# Patient Record
Sex: Female | Born: 1950
Health system: Southern US, Community
[De-identification: ages and names within clinical notes are randomized; demographics above are authoritative.]

## PROBLEM LIST (undated history)

## (undated) DIAGNOSIS — H547 Unspecified visual loss: Secondary | ICD-10-CM

## (undated) DIAGNOSIS — I10 Essential (primary) hypertension: Secondary | ICD-10-CM

## (undated) DIAGNOSIS — M1990 Unspecified osteoarthritis, unspecified site: Secondary | ICD-10-CM

## (undated) DIAGNOSIS — J45909 Unspecified asthma, uncomplicated: Secondary | ICD-10-CM

## (undated) DIAGNOSIS — L309 Dermatitis, unspecified: Secondary | ICD-10-CM

## (undated) HISTORY — PX: TONSILLECTOMY: SUR1361

## (undated) HISTORY — DX: Unspecified asthma, uncomplicated: J45.909

## (undated) HISTORY — PX: RETINAL DETACHMENT SURGERY: SHX105

## (undated) HISTORY — DX: Dermatitis, unspecified: L30.9

## (undated) HISTORY — PX: EYE SURGERY: SHX253

## (undated) HISTORY — PX: LUNG BIOPSY: SHX232

## (undated) HISTORY — PX: CHOLECYSTECTOMY: SHX55

## (undated) HISTORY — PX: CATARACT EXTRACTION, BILATERAL: SHX1313

## (undated) HISTORY — PX: COLONOSCOPY: SHX174

---

## 1999-05-25 ENCOUNTER — Encounter: Payer: Self-pay | Admitting: Emergency Medicine

## 1999-05-25 ENCOUNTER — Emergency Department (HOSPITAL_COMMUNITY): Admission: EM | Admit: 1999-05-25 | Discharge: 1999-05-25 | Payer: Self-pay | Admitting: Emergency Medicine

## 1999-08-22 ENCOUNTER — Encounter: Payer: Self-pay | Admitting: Emergency Medicine

## 1999-08-22 ENCOUNTER — Emergency Department (HOSPITAL_COMMUNITY): Admission: EM | Admit: 1999-08-22 | Discharge: 1999-08-22 | Payer: Self-pay | Admitting: Emergency Medicine

## 1999-12-30 ENCOUNTER — Other Ambulatory Visit: Admission: RE | Admit: 1999-12-30 | Discharge: 1999-12-30 | Payer: Self-pay | Admitting: Internal Medicine

## 2000-01-11 ENCOUNTER — Ambulatory Visit (HOSPITAL_COMMUNITY): Admission: RE | Admit: 2000-01-11 | Discharge: 2000-01-11 | Payer: Self-pay | Admitting: *Deleted

## 2000-01-16 ENCOUNTER — Ambulatory Visit (HOSPITAL_COMMUNITY): Admission: RE | Admit: 2000-01-16 | Discharge: 2000-01-16 | Payer: Self-pay | Admitting: Internal Medicine

## 2000-01-18 ENCOUNTER — Ambulatory Visit (HOSPITAL_COMMUNITY): Admission: RE | Admit: 2000-01-18 | Discharge: 2000-01-18 | Payer: Self-pay | Admitting: *Deleted

## 2000-01-24 ENCOUNTER — Ambulatory Visit (HOSPITAL_COMMUNITY): Admission: RE | Admit: 2000-01-24 | Discharge: 2000-01-24 | Payer: Self-pay | Admitting: Internal Medicine

## 2000-01-24 ENCOUNTER — Encounter: Payer: Self-pay | Admitting: Internal Medicine

## 2000-05-27 ENCOUNTER — Emergency Department (HOSPITAL_COMMUNITY): Admission: EM | Admit: 2000-05-27 | Discharge: 2000-05-28 | Payer: Self-pay | Admitting: *Deleted

## 2000-05-27 ENCOUNTER — Encounter: Payer: Self-pay | Admitting: Emergency Medicine

## 2000-05-28 ENCOUNTER — Inpatient Hospital Stay (HOSPITAL_COMMUNITY): Admission: EM | Admit: 2000-05-28 | Discharge: 2000-05-29 | Payer: Self-pay | Admitting: Emergency Medicine

## 2000-05-31 ENCOUNTER — Ambulatory Visit (HOSPITAL_COMMUNITY): Admission: RE | Admit: 2000-05-31 | Discharge: 2000-05-31 | Payer: Self-pay | Admitting: Gastroenterology

## 2000-06-14 ENCOUNTER — Encounter: Admission: RE | Admit: 2000-06-14 | Discharge: 2000-06-14 | Payer: Self-pay | Admitting: Otolaryngology

## 2000-06-14 ENCOUNTER — Encounter: Payer: Self-pay | Admitting: Otolaryngology

## 2000-10-05 ENCOUNTER — Encounter: Admission: RE | Admit: 2000-10-05 | Discharge: 2000-10-05 | Payer: Self-pay | Admitting: Otolaryngology

## 2000-10-05 ENCOUNTER — Encounter: Payer: Self-pay | Admitting: Otolaryngology

## 2001-01-13 ENCOUNTER — Encounter: Payer: Self-pay | Admitting: Emergency Medicine

## 2001-01-13 ENCOUNTER — Emergency Department (HOSPITAL_COMMUNITY): Admission: EM | Admit: 2001-01-13 | Discharge: 2001-01-13 | Payer: Self-pay | Admitting: Emergency Medicine

## 2001-03-07 ENCOUNTER — Ambulatory Visit (HOSPITAL_COMMUNITY): Admission: RE | Admit: 2001-03-07 | Discharge: 2001-03-07 | Payer: Self-pay | Admitting: Gastroenterology

## 2001-03-12 ENCOUNTER — Other Ambulatory Visit: Admission: RE | Admit: 2001-03-12 | Discharge: 2001-03-12 | Payer: Self-pay | Admitting: Family Medicine

## 2001-04-03 ENCOUNTER — Encounter: Payer: Self-pay | Admitting: Family Medicine

## 2001-04-03 ENCOUNTER — Ambulatory Visit (HOSPITAL_COMMUNITY): Admission: RE | Admit: 2001-04-03 | Discharge: 2001-04-03 | Payer: Self-pay

## 2002-01-31 ENCOUNTER — Encounter: Payer: Self-pay | Admitting: Emergency Medicine

## 2002-01-31 ENCOUNTER — Inpatient Hospital Stay (HOSPITAL_COMMUNITY): Admission: EM | Admit: 2002-01-31 | Discharge: 2002-02-02 | Payer: Self-pay | Admitting: Emergency Medicine

## 2002-04-08 ENCOUNTER — Encounter: Payer: Self-pay | Admitting: Family Medicine

## 2002-04-08 ENCOUNTER — Ambulatory Visit (HOSPITAL_COMMUNITY): Admission: RE | Admit: 2002-04-08 | Discharge: 2002-04-08 | Payer: Self-pay | Admitting: Family Medicine

## 2002-05-15 ENCOUNTER — Other Ambulatory Visit: Admission: RE | Admit: 2002-05-15 | Discharge: 2002-05-15 | Payer: Self-pay | Admitting: Family Medicine

## 2003-01-30 ENCOUNTER — Ambulatory Visit (HOSPITAL_COMMUNITY): Admission: RE | Admit: 2003-01-30 | Discharge: 2003-01-30 | Payer: Self-pay | Admitting: Family Medicine

## 2003-05-11 ENCOUNTER — Emergency Department (HOSPITAL_COMMUNITY): Admission: EM | Admit: 2003-05-11 | Discharge: 2003-05-11 | Payer: Self-pay | Admitting: Emergency Medicine

## 2003-06-12 ENCOUNTER — Ambulatory Visit (HOSPITAL_COMMUNITY): Admission: RE | Admit: 2003-06-12 | Discharge: 2003-06-12 | Payer: Self-pay | Admitting: Family Medicine

## 2003-06-29 ENCOUNTER — Emergency Department (HOSPITAL_COMMUNITY): Admission: EM | Admit: 2003-06-29 | Discharge: 2003-06-29 | Payer: Self-pay | Admitting: Emergency Medicine

## 2003-07-03 ENCOUNTER — Encounter: Payer: Self-pay | Admitting: Emergency Medicine

## 2003-07-03 ENCOUNTER — Emergency Department (HOSPITAL_COMMUNITY): Admission: EM | Admit: 2003-07-03 | Discharge: 2003-07-03 | Payer: Self-pay | Admitting: Emergency Medicine

## 2003-08-11 ENCOUNTER — Encounter: Payer: Self-pay | Admitting: Emergency Medicine

## 2003-08-11 ENCOUNTER — Emergency Department (HOSPITAL_COMMUNITY): Admission: EM | Admit: 2003-08-11 | Discharge: 2003-08-11 | Payer: Self-pay | Admitting: Emergency Medicine

## 2003-10-18 ENCOUNTER — Emergency Department (HOSPITAL_COMMUNITY): Admission: EM | Admit: 2003-10-18 | Discharge: 2003-10-18 | Payer: Self-pay | Admitting: Emergency Medicine

## 2003-11-04 ENCOUNTER — Emergency Department (HOSPITAL_COMMUNITY): Admission: EM | Admit: 2003-11-04 | Discharge: 2003-11-04 | Payer: Self-pay | Admitting: Emergency Medicine

## 2004-01-02 ENCOUNTER — Emergency Department (HOSPITAL_COMMUNITY): Admission: EM | Admit: 2004-01-02 | Discharge: 2004-01-02 | Payer: Self-pay | Admitting: Emergency Medicine

## 2004-01-09 ENCOUNTER — Ambulatory Visit (HOSPITAL_COMMUNITY): Admission: RE | Admit: 2004-01-09 | Discharge: 2004-01-09 | Payer: Self-pay | Admitting: Family Medicine

## 2004-02-29 ENCOUNTER — Emergency Department (HOSPITAL_COMMUNITY): Admission: RE | Admit: 2004-02-29 | Discharge: 2004-03-01 | Payer: Self-pay | Admitting: Neurology

## 2004-04-04 ENCOUNTER — Emergency Department (HOSPITAL_COMMUNITY): Admission: EM | Admit: 2004-04-04 | Discharge: 2004-04-04 | Payer: Self-pay | Admitting: Emergency Medicine

## 2004-06-16 ENCOUNTER — Ambulatory Visit (HOSPITAL_COMMUNITY): Admission: RE | Admit: 2004-06-16 | Discharge: 2004-06-16 | Payer: Self-pay | Admitting: Family Medicine

## 2004-07-12 ENCOUNTER — Other Ambulatory Visit: Admission: RE | Admit: 2004-07-12 | Discharge: 2004-07-12 | Payer: Self-pay | Admitting: Family Medicine

## 2004-08-09 ENCOUNTER — Ambulatory Visit (HOSPITAL_COMMUNITY): Admission: RE | Admit: 2004-08-09 | Discharge: 2004-08-09 | Payer: Self-pay | Admitting: Family Medicine

## 2004-08-17 ENCOUNTER — Ambulatory Visit (HOSPITAL_COMMUNITY): Admission: RE | Admit: 2004-08-17 | Discharge: 2004-08-17 | Payer: Self-pay | Admitting: Family Medicine

## 2004-09-02 ENCOUNTER — Ambulatory Visit (HOSPITAL_COMMUNITY): Admission: RE | Admit: 2004-09-02 | Discharge: 2004-09-02 | Payer: Self-pay | Admitting: Family Medicine

## 2004-09-22 ENCOUNTER — Encounter: Admission: RE | Admit: 2004-09-22 | Discharge: 2004-12-21 | Payer: Self-pay | Admitting: Orthopedic Surgery

## 2004-12-22 ENCOUNTER — Encounter: Admission: RE | Admit: 2004-12-22 | Discharge: 2005-01-13 | Payer: Self-pay | Admitting: Orthopedic Surgery

## 2005-01-13 ENCOUNTER — Ambulatory Visit: Payer: Self-pay | Admitting: Family Medicine

## 2005-01-16 ENCOUNTER — Ambulatory Visit (HOSPITAL_COMMUNITY): Admission: RE | Admit: 2005-01-16 | Discharge: 2005-01-16 | Payer: Self-pay | Admitting: Family Medicine

## 2005-02-03 ENCOUNTER — Ambulatory Visit: Payer: Self-pay | Admitting: Family Medicine

## 2005-06-05 ENCOUNTER — Ambulatory Visit: Payer: Self-pay | Admitting: Physical Medicine & Rehabilitation

## 2005-06-05 ENCOUNTER — Encounter
Admission: RE | Admit: 2005-06-05 | Discharge: 2005-09-03 | Payer: Self-pay | Admitting: Physical Medicine & Rehabilitation

## 2005-07-02 ENCOUNTER — Emergency Department (HOSPITAL_COMMUNITY): Admission: EM | Admit: 2005-07-02 | Discharge: 2005-07-02 | Payer: Self-pay | Admitting: Emergency Medicine

## 2005-07-06 ENCOUNTER — Ambulatory Visit: Payer: Self-pay | Admitting: Physical Medicine & Rehabilitation

## 2005-07-11 ENCOUNTER — Ambulatory Visit (HOSPITAL_COMMUNITY): Admission: RE | Admit: 2005-07-11 | Discharge: 2005-07-11 | Payer: Self-pay | Admitting: Family Medicine

## 2005-07-17 ENCOUNTER — Ambulatory Visit (HOSPITAL_COMMUNITY): Admission: RE | Admit: 2005-07-17 | Discharge: 2005-07-17 | Payer: Self-pay | Admitting: Family Medicine

## 2005-07-31 ENCOUNTER — Ambulatory Visit (HOSPITAL_COMMUNITY): Admission: RE | Admit: 2005-07-31 | Discharge: 2005-07-31 | Payer: Self-pay | Admitting: Gastroenterology

## 2005-07-31 ENCOUNTER — Encounter (INDEPENDENT_AMBULATORY_CARE_PROVIDER_SITE_OTHER): Payer: Self-pay | Admitting: Specialist

## 2005-08-03 ENCOUNTER — Ambulatory Visit: Payer: Self-pay | Admitting: Family Medicine

## 2005-10-03 ENCOUNTER — Ambulatory Visit: Payer: Self-pay | Admitting: Family Medicine

## 2005-11-06 ENCOUNTER — Ambulatory Visit: Payer: Self-pay | Admitting: Family Medicine

## 2005-11-28 ENCOUNTER — Ambulatory Visit: Payer: Self-pay | Admitting: Family Medicine

## 2005-12-11 ENCOUNTER — Ambulatory Visit: Payer: Self-pay | Admitting: Family Medicine

## 2006-01-18 ENCOUNTER — Ambulatory Visit: Payer: Self-pay | Admitting: Family Medicine

## 2006-05-18 ENCOUNTER — Ambulatory Visit: Payer: Self-pay | Admitting: Family Medicine

## 2006-07-19 ENCOUNTER — Encounter (INDEPENDENT_AMBULATORY_CARE_PROVIDER_SITE_OTHER): Payer: Self-pay | Admitting: *Deleted

## 2006-07-19 ENCOUNTER — Ambulatory Visit: Payer: Self-pay | Admitting: Family Medicine

## 2006-07-24 ENCOUNTER — Ambulatory Visit (HOSPITAL_COMMUNITY): Admission: RE | Admit: 2006-07-24 | Discharge: 2006-07-24 | Payer: Self-pay | Admitting: Family Medicine

## 2006-08-07 ENCOUNTER — Ambulatory Visit: Payer: Self-pay | Admitting: Family Medicine

## 2006-12-04 ENCOUNTER — Ambulatory Visit: Payer: Self-pay | Admitting: Family Medicine

## 2006-12-04 ENCOUNTER — Encounter (INDEPENDENT_AMBULATORY_CARE_PROVIDER_SITE_OTHER): Payer: Self-pay | Admitting: *Deleted

## 2007-01-21 ENCOUNTER — Ambulatory Visit: Payer: Self-pay | Admitting: Family Medicine

## 2007-01-25 ENCOUNTER — Ambulatory Visit (HOSPITAL_COMMUNITY): Admission: RE | Admit: 2007-01-25 | Discharge: 2007-01-25 | Payer: Self-pay | Admitting: Family Medicine

## 2007-04-01 ENCOUNTER — Ambulatory Visit: Payer: Self-pay | Admitting: Family Medicine

## 2007-07-02 DIAGNOSIS — K5909 Other constipation: Secondary | ICD-10-CM

## 2007-07-02 DIAGNOSIS — K219 Gastro-esophageal reflux disease without esophagitis: Secondary | ICD-10-CM

## 2007-07-02 DIAGNOSIS — M545 Low back pain, unspecified: Secondary | ICD-10-CM

## 2007-07-02 DIAGNOSIS — L639 Alopecia areata, unspecified: Secondary | ICD-10-CM | POA: Insufficient documentation

## 2007-07-02 DIAGNOSIS — J984 Other disorders of lung: Secondary | ICD-10-CM

## 2007-07-02 DIAGNOSIS — H209 Unspecified iridocyclitis: Secondary | ICD-10-CM

## 2007-07-02 DIAGNOSIS — J4520 Mild intermittent asthma, uncomplicated: Secondary | ICD-10-CM

## 2007-07-02 DIAGNOSIS — N951 Menopausal and female climacteric states: Secondary | ICD-10-CM

## 2007-07-02 DIAGNOSIS — F341 Dysthymic disorder: Secondary | ICD-10-CM

## 2007-07-02 DIAGNOSIS — Z87891 Personal history of nicotine dependence: Secondary | ICD-10-CM

## 2007-07-02 HISTORY — DX: Dysthymic disorder: F34.1

## 2007-07-02 HISTORY — DX: Gastro-esophageal reflux disease without esophagitis: K21.9

## 2007-07-02 HISTORY — DX: Alopecia areata, unspecified: L63.9

## 2007-07-02 HISTORY — DX: Other constipation: K59.09

## 2007-07-02 HISTORY — DX: Other disorders of lung: J98.4

## 2007-07-02 HISTORY — DX: Low back pain, unspecified: M54.50

## 2007-07-02 HISTORY — DX: Menopausal and female climacteric states: N95.1

## 2007-07-02 HISTORY — DX: Personal history of nicotine dependence: Z87.891

## 2007-07-02 HISTORY — DX: Unspecified iridocyclitis: H20.9

## 2007-07-02 HISTORY — DX: Mild intermittent asthma, uncomplicated: J45.20

## 2007-07-17 ENCOUNTER — Emergency Department (HOSPITAL_COMMUNITY): Admission: EM | Admit: 2007-07-17 | Discharge: 2007-07-18 | Payer: Self-pay | Admitting: Emergency Medicine

## 2007-08-01 ENCOUNTER — Ambulatory Visit (HOSPITAL_COMMUNITY): Admission: RE | Admit: 2007-08-01 | Discharge: 2007-08-01 | Payer: Self-pay | Admitting: Family Medicine

## 2008-03-13 ENCOUNTER — Ambulatory Visit: Payer: Self-pay | Admitting: Family Medicine

## 2008-03-13 LAB — CONVERTED CEMR LAB
ALT: 16 units/L (ref 0–35)
AST: 21 units/L (ref 0–37)
Albumin: 4.3 g/dL (ref 3.5–5.2)
Alkaline Phosphatase: 104 units/L (ref 39–117)
BUN: 13 mg/dL (ref 6–23)
Basophils Absolute: 0 10*3/uL (ref 0.0–0.1)
Basophils Relative: 0 % (ref 0–1)
CO2: 26 meq/L (ref 19–32)
Calcium: 9.6 mg/dL (ref 8.4–10.5)
Chloride: 103 meq/L (ref 96–112)
Cholesterol: 213 mg/dL — ABNORMAL HIGH (ref 0–200)
Creatinine, Ser: 0.74 mg/dL (ref 0.40–1.20)
Eosinophils Absolute: 0.2 10*3/uL (ref 0.0–0.7)
Eosinophils Relative: 2 % (ref 0–5)
Free T4: 1.06 ng/dL (ref 0.89–1.80)
Glucose, Bld: 86 mg/dL (ref 70–99)
HCT: 38.8 % (ref 36.0–46.0)
HDL: 81 mg/dL (ref >39)
Hemoglobin: 12 g/dL (ref 12.0–15.0)
LDL Cholesterol: 116 mg/dL — ABNORMAL HIGH (ref 0–99)
Lymphocytes Relative: 24 % (ref 12–46)
Lymphs Abs: 2 10*3/uL (ref 0.7–4.0)
MCHC: 30.9 g/dL (ref 30.0–36.0)
MCV: 72.8 fL — ABNORMAL LOW (ref 78.0–100.0)
Monocytes Absolute: 0.5 10*3/uL (ref 0.1–1.0)
Monocytes Relative: 6 % (ref 3–12)
Neutro Abs: 5.5 10*3/uL (ref 1.7–7.7)
Neutrophils Relative %: 68 % (ref 43–77)
Platelets: 214 10*3/uL (ref 150–400)
Potassium: 3.8 meq/L (ref 3.5–5.3)
RBC: 5.33 M/uL — ABNORMAL HIGH (ref 3.87–5.11)
RDW: 14.6 % (ref 11.5–15.5)
Sodium: 141 meq/L (ref 135–145)
T3 Uptake Ratio: 28.2 % (ref 22.5–37.0)
T3, Total: 157.5 ng/dL (ref 80.0–204.0)
TSH: 1.545 microintl units/mL (ref 0.350–5.50)
Total Bilirubin: 0.7 mg/dL (ref 0.3–1.2)
Total CHOL/HDL Ratio: 2.6
Total Protein: 8 g/dL (ref 6.0–8.3)
Triglycerides: 81 mg/dL (ref <150)
VLDL: 16 mg/dL (ref 0–40)
WBC: 8.1 10*3/uL (ref 4.0–10.5)

## 2008-03-17 ENCOUNTER — Ambulatory Visit (HOSPITAL_COMMUNITY): Admission: RE | Admit: 2008-03-17 | Discharge: 2008-03-17 | Payer: Self-pay | Admitting: Family Medicine

## 2008-03-23 ENCOUNTER — Ambulatory Visit (HOSPITAL_COMMUNITY): Admission: RE | Admit: 2008-03-23 | Discharge: 2008-03-23 | Payer: Self-pay | Admitting: Ophthalmology

## 2008-04-16 ENCOUNTER — Emergency Department (HOSPITAL_COMMUNITY): Admission: EM | Admit: 2008-04-16 | Discharge: 2008-04-17 | Payer: Self-pay | Admitting: Emergency Medicine

## 2008-06-09 ENCOUNTER — Ambulatory Visit: Payer: Self-pay | Admitting: Family Medicine

## 2008-06-09 LAB — CONVERTED CEMR LAB
BUN: 13 mg/dL (ref 6–23)
Basophils Absolute: 0 10*3/uL (ref 0.0–0.1)
Basophils Relative: 0 % (ref 0–1)
CO2: 25 meq/L (ref 19–32)
Calcium: 9.1 mg/dL (ref 8.4–10.5)
Chloride: 106 meq/L (ref 96–112)
Cholesterol: 197 mg/dL (ref 0–200)
Creatinine, Ser: 0.76 mg/dL (ref 0.40–1.20)
Eosinophils Absolute: 0.1 10*3/uL (ref 0.0–0.7)
Eosinophils Relative: 1 % (ref 0–5)
Free T4: 1.11 ng/dL (ref 0.89–1.80)
Glucose, Bld: 91 mg/dL (ref 70–99)
HCT: 36.6 % (ref 36.0–46.0)
HDL: 73 mg/dL (ref >39)
Hemoglobin: 11.2 g/dL — ABNORMAL LOW (ref 12.0–15.0)
LDL Cholesterol: 104 mg/dL — ABNORMAL HIGH (ref 0–99)
Lymphocytes Relative: 31 % (ref 12–46)
Lymphs Abs: 2.4 10*3/uL (ref 0.7–4.0)
MCHC: 30.6 g/dL (ref 30.0–36.0)
MCV: 73.1 fL — ABNORMAL LOW (ref 78.0–100.0)
Magnesium: 1.9 mg/dL (ref 1.5–2.5)
Monocytes Absolute: 0.6 10*3/uL (ref 0.1–1.0)
Monocytes Relative: 8 % (ref 3–12)
Neutro Abs: 4.6 10*3/uL (ref 1.7–7.7)
Neutrophils Relative %: 60 % (ref 43–77)
Platelets: 213 10*3/uL (ref 150–400)
Potassium: 3.7 meq/L (ref 3.5–5.3)
RBC: 5.01 M/uL (ref 3.87–5.11)
RDW: 15.3 % (ref 11.5–15.5)
Sodium: 144 meq/L (ref 135–145)
TSH: 2.262 microintl units/mL (ref 0.350–5.50)
Total CHOL/HDL Ratio: 2.7
Triglycerides: 101 mg/dL (ref <150)
VLDL: 20 mg/dL (ref 0–40)
Vit D, 1,25-Dihydroxy: 12 — ABNORMAL LOW (ref 30–89)
WBC: 7.7 10*3/uL (ref 4.0–10.5)

## 2008-08-04 ENCOUNTER — Ambulatory Visit (HOSPITAL_COMMUNITY): Admission: RE | Admit: 2008-08-04 | Discharge: 2008-08-04 | Payer: Self-pay | Admitting: Family Medicine

## 2008-10-09 ENCOUNTER — Encounter: Admission: RE | Admit: 2008-10-09 | Discharge: 2008-10-09 | Payer: Self-pay | Admitting: Family Medicine

## 2008-11-28 ENCOUNTER — Emergency Department (HOSPITAL_COMMUNITY): Admission: EM | Admit: 2008-11-28 | Discharge: 2008-11-28 | Payer: Self-pay | Admitting: Emergency Medicine

## 2009-08-17 ENCOUNTER — Ambulatory Visit (HOSPITAL_COMMUNITY): Admission: RE | Admit: 2009-08-17 | Discharge: 2009-08-17 | Payer: Self-pay | Admitting: Family Medicine

## 2009-12-29 ENCOUNTER — Ambulatory Visit (HOSPITAL_COMMUNITY): Admission: RE | Admit: 2009-12-29 | Discharge: 2009-12-29 | Payer: Self-pay | Admitting: Ophthalmology

## 2011-03-12 LAB — CBC
HCT: 39.1 % (ref 36.0–46.0)
Hemoglobin: 12.9 g/dL (ref 12.0–15.0)
MCHC: 32.9 g/dL (ref 30.0–36.0)
MCV: 70.9 fL — ABNORMAL LOW (ref 78.0–100.0)
Platelets: 230 10*3/uL (ref 150–400)
RBC: 5.52 MIL/uL — ABNORMAL HIGH (ref 3.87–5.11)
RDW: 14 % (ref 11.5–15.5)
WBC: 8.5 10*3/uL (ref 4.0–10.5)

## 2011-03-12 LAB — BASIC METABOLIC PANEL
BUN: 9 mg/dL (ref 6–23)
CO2: 28 mEq/L (ref 19–32)
Calcium: 9.4 mg/dL (ref 8.4–10.5)
Chloride: 105 mEq/L (ref 96–112)
Creatinine, Ser: 0.74 mg/dL (ref 0.4–1.2)
GFR calc Af Amer: >60 mL/min (ref >60)
GFR calc non Af Amer: >60 mL/min (ref >60)
Glucose, Bld: 102 mg/dL — ABNORMAL HIGH (ref 70–99)
Potassium: 4.5 mEq/L (ref 3.5–5.1)
Sodium: 142 mEq/L (ref 135–145)

## 2011-04-24 ENCOUNTER — Ambulatory Visit: Payer: Medicare Other | Attending: Family Medicine | Admitting: Physical Therapy

## 2011-04-24 DIAGNOSIS — R269 Unspecified abnormalities of gait and mobility: Secondary | ICD-10-CM | POA: Insufficient documentation

## 2011-04-24 DIAGNOSIS — IMO0001 Reserved for inherently not codable concepts without codable children: Secondary | ICD-10-CM | POA: Insufficient documentation

## 2011-04-24 DIAGNOSIS — R42 Dizziness and giddiness: Secondary | ICD-10-CM | POA: Insufficient documentation

## 2011-05-08 ENCOUNTER — Ambulatory Visit: Payer: Medicare Other | Attending: Family Medicine | Admitting: Physical Therapy

## 2011-05-08 DIAGNOSIS — IMO0001 Reserved for inherently not codable concepts without codable children: Secondary | ICD-10-CM | POA: Insufficient documentation

## 2011-05-08 DIAGNOSIS — R269 Unspecified abnormalities of gait and mobility: Secondary | ICD-10-CM | POA: Insufficient documentation

## 2011-05-08 DIAGNOSIS — R42 Dizziness and giddiness: Secondary | ICD-10-CM | POA: Insufficient documentation

## 2011-05-09 NOTE — Op Note (Signed)
NAMEDAWNNA, GRITZ              ACCOUNT NO.:  [id number]   MEDICAL RECORD NO.:  [id number]          PATIENT TYPE:  AMB   LOCATION:  SDS                          FACILITY:  [facility]   PHYSICIAN:  Alford Highland. Rankin, M.D.   DATE OF BIRTH:  [birth year]   DATE OF PROCEDURE:  03/23/2008  DATE OF DISCHARGE:                               OPERATIVE REPORT   PREOPERATIVE DIAGNOSES:  1. Tractional retinal detachment, right eye secondary to  2. Vitreal macular retraction syndrome, right eye with epiretinal      membrane.  3. Epiretinal membrane.  4. Panuveitis.  5. 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   POSTOPERATIVE DIAGNOSES:   OPERATION PERFORMED:  1. Posterior vitrectomy with membrane peel--epiretinal membrane--25      gauge, right eye.  2. Insertion of Retisert implant--inferonasal quadrant of the right      eye.   SURGEON:  Alford Highland. Rankin, M.D.   ANESTHESIA:  Attempted local retrobulbar with monitored anesthesia  control of the right eye, but the patient was highly talkative, highly  sensitive to any  type of touching and all very talkative and we were  unable to proceed with this under local anesthesia and so converted to  general endotracheal anesthesia.   INDICATIONS FOR PROCEDURE:  The patient is a 59 year old woman with  profound vision loss in each eye, both eyes on the basis of Harada's  syndrome with chorioretinal scarring, panuveitis, now has progressive  vision loss of the right eye and elevation of the retina secondary to  vitreal macular retraction syndrome and epiretinal membrane with partial  vitreous elevation coming from the midperipheral vitreous onto the  foveal region.  The patient understands this is an attempt to surgically  remove the vitreous scarring.  She also understands this is an attempt  to place Retisert implant so as to  help control her symptoms on an  ongoing basis.  She understands the risks of anesthesia including the  rare occurrence of death but also to the eye including but not limited  to hemorrhage, infection, scarring, need for another surgery, no change  in vision, loss of vision and progression of disease despite  intervention.  After appropriate signed consent was obtained, the  patient was taken to the operating room.   DESCRIPTION OF PROCEDURE:  In the operating room, appropriate monitoring  was followed by mild sedation.  2% Xylocaine was injected retrobulbar 5  mL followed by an additional 5 mL laterally in the fashion of modified  Darel Hong.  Nonetheless over the next 10 to 15 minutes, attempts to prep  the eye and also to place the sterile drapes resulted in the patient  complaining bitterly and repeatedly about discomfort, ill at ease,  extremely nervous, then was resistant to medical therapy in this regard.  The decision was made at this time for the safety of her eye and her to  convert to general anesthesia.  General endotracheal anesthesia was  administered  without difficulty.  The right periocular region was  sterilely prepped and draped again.  The right periocular region was  sterilely prepped and draped in the usual ophthalmic fashion.  A lid  speculum was applied.  25 gauge trocar was placed in the infratemporal  quadrant.  The conjunctiva was quite mobile.  The superior trocar was  applied.  Core vitrectomy was then begun.  Biom attachment on the  microscope was used to visualize the retina. Notable findings were that  the vitreous was attached very strongly adherently at the equator and  anteriorly and the areas of chorioretinal scarring which was also  attached in a tent-like fashion directly to the fovea and the optic  nerve.  The 25 gauge forceps was then used to remove the attachment off  the optic nerve and subsequently off the foveal region.  This was done  in a  circular tear fashion so as to prevent formation of a thin retinal  hole and because of extensive macular and foveal atrophy.   No tissue was removed in this fashion.  The epiretinal tissue was  elevated without trauma to the fovea.  At this time attention was drawn  to placing the Retisert implant in the inferotemporal quadrant.  Scleral  plugs were placed in the trocars.  The MVR blade was then used to create  a curvilinear incision in a tangential fashion to the limbus 3.5 mm  posterior to the limbus. 10-0 Prolene was then used after the Retisert  implant had been irrigated.  10-0 Prolene through the eyelet was placed.  Thereafter 10-0 Prolene was then used to secure to the inner lips of the  scleral wound and was then placed in secure fashion after the knot had  been tied through the eyelet.  Then subsequently the knot was then tied  to the inner aspect of the scleral wound.  Excellent placement in the  vitreous cavity was confirmed.  At this time the scleral opening was  then closed with an interrupted 8-0 nylon sutures.  The knots were  rotated away from the limbus.  No exposed Prolene was seen.  At this  time the conjunctiva which had been previously in the inferotemporal and  inferonasal quadrant was now closed with 7-0 Vicryl with knots buried.  At this time fluid-air exchange was completed.  The retina flattened  nicely on the macular region.  Thereafter an air-SF6 10% exchange was  completed.  Superior trocars were removed from the eye.  Infusion  removed.  Subconjunctival injection of Decadron applied.  A sterile  patch and Fox shield were applied.  The patient tolerated the procedure  well without complication. She was then taken to the PACU in good and  stable condition.      Alford Highland Rankin, M.D.  Electronically Signed     GAR/MEDQ  D:  03/23/2008  T:  03/23/2008  Job:  [id number]

## 2011-05-12 NOTE — Procedures (Signed)
[place]  Patient:    Jillian Taylor, Jillian Taylor                     MRN: [id number] Proc. Date: 05/31/00 Adm. Date:  [id number] Disc. Date: [id number] Attending:  Charna Elizabeth CC:         Merlene Laughter. Renae Gloss, M.D.                           Procedure Report  DATE OF BIRTH:  1951-04-03.  REFERRING PHYSICIAN:  Merlene Laughter. Renae Gloss, M.D.  PROCEDURE PERFORMED:  Colonoscopy.  ENDOSCOPIST:  Anselmo Rod, M.D.  INSTRUMENT USED:  Olympus video colonoscope.  INDICATION FOR PROCEDURE:  Rectal bleeding in a 60 year old black female; rule out colonic polyps, masses, hemorrhoids, etc.  PREPROCEDURE PREPARATION:  Informed consent was procured from the patient. The patient was fasted for eight hours prior to the procedure and prepped with a bottle of magnesium citrate and a gallon of NuLytely the night prior to the procedure.  PREPROCEDURE PHYSICAL:  VITAL SIGNS:  Patient had stable vital signs.  NECK:  Supple.  CHEST:  Clear to auscultation.  S1 and S2 regular.  No murmur, rub or gallop. No rales, rhonchi or wheezing.  ABDOMEN:  Soft with normal abdominal bowel sounds.  DESCRIPTION OF PROCEDURE:  The patient was placed in the left lateral decubitus position and sedated with Demerol 75 mg and Versed 8 mg intravenously.  Once the patient was adequately sedate and maintained on low-flow oxygen and continuous cardiac monitoring, the Olympus video colonoscope was advanced from the rectum to the cecum without difficulty. Except for small internal and external hemorrhoids, no other abnormalities were seen.  The entire colonic mucosa appeared healthy with normal vascular pattern.  No masses, polyps, erosions, ulcerations or diverticula were present.  IMPRESSION:  Normal colonoscope except for small internal and external hemorrhoids.  RECOMMENDATIONS: 1. The patient has been advised to increase the fluid and fiber in her diet. 2. Stool softeners have been  advised to maintain bowel regularity and prevent    rectal irritation of hemorrhoids with hard stool. 3. Outpatient followup is advised in the next two weeks for further    recommendations. DD:  05/31/00 TD:  06/04/00 Job: [id number]

## 2011-05-12 NOTE — Procedures (Signed)
[place]. [facility]  Patient:    Jillian Taylor, Jillian Taylor                     MRN: [id number] Proc. Date: 03/07/01 Adm. Date:  [id number] Attending:  Charna Elizabeth CC:         Merlene Laughter. Renae Gloss, M.D.   Procedure Report  DATE OF BIRTH:  04-Aug-1951.  PROCEDURE:  Esophagogastroduodenoscopy.  ENDOSCOPIST:  Anselmo Rod, M.D.  INSTRUMENT USED:  Olympus video panendoscope.  INDICATION FOR PROCEDURE:  Epigastric pain not responding to PPIs.  Rule out peptic ulcer disease, esophagitis, gastritis, etc.  PREPROCEDURE PREPARATION:  Informed consent was procured from the patient. The patient was fasted for eight hours prior to the procedure.  PREPROCEDURE PHYSICAL:  VITAL SIGNS:  The patient had stable vital signs.  NECK:  Supple.  CHEST:  Clear to auscultation.  S1, S2 regular.  ABDOMEN:  Morbidly obese with epigastric tenderness on palpation, with no rebound, no rigidity, no hepatosplenomegaly appreciated.  DESCRIPTION OF PROCEDURE:  The patient was placed in the left lateral decubitus position and sedated with 60 mg of Demerol and 6 mg of Versed intravenously.  Once the patient was adequately sedate and maintained on low-flow oxygen and continuous cardiac monitoring, the Olympus video panendoscope was advanced through the mouthpiece, over the tongue, into the esophagus under direct vision.  The entire esophagus appeared normal.  There was a 4-5 cm hiatal hernia seen on retroflexion in the stomach.  The rest of the gastric mucosa and the proximal small bowel appeared normal except for mild midbody and antral gastritis.  No ulcers or masses were seen.  IMPRESSION: 1. Normal-appearing esophagus and proximal small bowel. 2. A 4-5 cm hiatal hernia seen. 3. Mild midbody and antral gastritis.  RECOMMENDATIONS:  CT scan of the abdomen and pelvis will be done to further work up the patients abdominal pain.  Further recommendation made at that time. DD:   03/07/01 TD:  03/08/01 Job: [id number]

## 2011-05-12 NOTE — Discharge Summary (Signed)
[facility]  Patient:    Jillian Taylor, Jillian Taylor Visit Number: [id number] MRN: [id number]          Service Type: MED Location: [id number] Attending Physician:  Anastasio Auerbach Dictated by:   Anastasio Auerbach, M.D. Admit Date:  01/31/2002 Discharge Date: 02/02/2002   CC:         Moshe Salisbury. Audria Nine, M.D.   Discharge Summary  DATE OF BIRTH:  10-15-2051  DISCHARGE DIAGNOSES:  1. Acute bronchitis with reactive airway disease.  2. Chest wall pain secondary to cough.  3. Allergic rhinitis.  4. Iron-deficiency anemia.     a. Discharge hemoglobin 9.8, MCV 66.     b. Total iron 47, total iron binding capacity 370, percent saturation low       at 13.  5. History of pulmonary nodule/inflammation.     a. Reaction to medications ?, 1992.     b. CT this admission:  No significant changes.     c. Biopsy in 1992:  Inflammation (per patient).     d. There has been no further workup - back then stated it would resolve.  6. Nonspecific prominent lymph nodes in the mediastinum and left hilum.     a. Recommend followup CT in 2 to 3 months to assess stability.     b. Must pre-treat for INTRAVENOUS DYE allergy.  7. Chronic constipation.  8. Gastroesophageal reflux disease.  9. 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     a. Legally blind.     b. On chronic steroids up until one year ago. 10. History of a rash to intravenous contrast.     a. Pre-treat with prednisone and Benadryl before CT scans. 11. Post/peri-menopausal.     a. Last menstrual period two years ago. 12. History of tobacco use.     a. Quit approximately one year ago. 13. History of rectal bleeding, 6/01.     a. Outpatient esophagogastroduodenoscopy - acid reflux.     b. Negative except for anal fissures. 14. Status post cholecystectomy in 1996.  DISCHARGE MEDICATIONS:  1. (New) Azithromax 250 mg q.d. through 12/04/01 (total of 5 days therapy).  2. (New) Humibid LA 600 mg two p.o. b.i.d. x1 week.  3. (New)  Nasonex nasal spray 1 spray in each nostril q.d.  4. (New) Protonix 40 mg q.d.  5. (New) Albuterol nebulizer 2.5 mg q.i.d. x7 days.  6. (New) Atrovent nebulizer 0.5 mg q.i.d. x7 days.  7. (New) Flovent inhaler with spacer 2 puffs b.i.d.  8. (New) Vioxx 25 mg q.d. x5 days.  9. (New) Darvocet one or two q.4h. p.r.n. pain (maximum 6 per day), dispense    #20. 10. (New) Iron sulfate 325 mg p.o. q.d. x1 month. 11. Pred-Forte eye drops as before.  CONDITION ON DISCHARGE:  Stable.  Blood pressure 104/58, heart rate 68, respiratory rate 20, oxygen saturation 96% on room air, weight 265.  DISPOSITION:  Home with family.  ACTIVITY:  As tolerated.  DIET:  Low fat.  Drink plenty of water.  DISCHARGE INSTRUCTIONS:  Call or return if problems occur.  FOLLOWUP: 1. The patient is to contact Dr. Audria Nine at [facility] to schedule a followup in 2 to 3 weeks. 2. She will need a repeat CT scan in 3 to 4 months to follow up adenopathy, and consider baseline pulmonary function tests.  I would also start her on iron as an outpatient and follow her hemoglobin.  CONSULTATIONS:  None.  PROCEDURES: 1. EKG:  Normal  sinus rhythm.  Early repolarization pattern. 2. Chest x-ray two view (01/31/02):  Multiple pulmonary nodules which appear to   be similar in size to the previous CT scan from 01/24/00.  The largest is on   the left being 3.2 x 2.7 cm, and the largest on the right being 2.0 cm. 3. Spiral chest CT with lower extremity cuts (02/01/02):  No evidence of pulmonary embolic disease.  Stable calcified bilateral pulmonary nodules, previously biopsied and thought to be postinflammatory.  Minimally prominent lymph nodes, mediastinum and left hilum, recommend followup evaluation of the chest by CT with contrast in 2 to 3 months to assess stability, as these are non-specific in etiology.  No evidence of deep venous thrombosis.  HOSPITAL COURSE:  #1 - BRONCHITIS WITH REACTIVE AIRWAY DISEASE:  Jennet Scroggin is a 60 year old African-American female with a history of non-specific bilateral pulmonary inflammation who presents with a two day history of mildly productive cough and associated shortness of breath, and expiratory chest pressure.  She describes some allergy symptoms, as well as some purulent sputum.  Chest x-ray is negative for acute infiltrate.  Spiral CT shows no evidence of pulmonary embolus.  She did have some nonspecific mediastinal and left hilar lymph nodes (see below).  Orie was hesitant to be placed on prednisone, even for a short period of time.  I did have to use three doses to pre-treat her for a CT scan with contrast.  Other than that, I did not send her home on any.  We used nebulizers and supportive care.  At the time of discharge, she was improved, and we were treating her chest pain with a short course of Vioxx, as well as Darvocet for breakthrough.  She will be maintained on a Flovent inhaler, and we also started Nasonex for any allergic rhinitis symptoms.  I offered her Claritin, but she declined.  #2 - IRON-DEFICIENCY ANEMIA:  Kalina had endoscopy and colonoscopy in the past.  She does have rectal fissures.  Iron studies were pending at the time of discharge.  At the time of this dictation, she does appear to have iron-deficiency with only 13% iron saturation.  Recommend starting her on iron and watching her hemoglobin.  #3 - CHRONIC PULMONARY POSTINFLAMMATION CHANGES:  Jannine describes no symptoms of dyspnea on exertion when she is not having acute reactive airway disease. Given the underlying abnormality, however, I would consider pulmonary function tests just to establish a baseline.  #4 - NONSPECIFIC MEDIASTINAL ADENOPATHY:  She does need a followup CT scan in 2 to 3 months.  #5 - ALLERGY TO INTRAVENOUS CONTRAST:  We did end up pre-treating Ezekiel Ina with the protocol per radiology.  She was given 50 mg of prednisone  x3.  This was repeated  at 7 hours and 13 hours.  At 13 hours, she also received 50 mg of Benadryl.  She had no difficulty with the IV contrast with that regimen.  #6 - GASTROESOPHAGEAL REFLUX DISEASE:  Given the patient had doses of prednisone, as well as some Vioxx, and symptoms of acid reflux, I did place her on a proton pump inhibitor.  DISCHARGE LABORATORY DATA:  Hemoglobin 9.8, MCV 66, white blood cell count 11,300 (suspect this jump was steroid effect), platelet count 212.  Sodium 136, potassium 3.9, chloride 105, bicarbonate 29, BUN 10, creatinine 0.8, glucose 102.  Total iron 47, TIBC 370, percent saturation 13.  Room air blood gas pH 7.53, PCO2 30, PO2 118.  Sedimentation rate 41 (nonspecific).  Dictated by:   Anastasio Auerbach, M.D. Attending Physician:  Anastasio Auerbach DD:  02/06/02 TD:  02/07/02 Job: [id number]

## 2011-05-12 NOTE — Consult Note (Signed)
. [facility]  Patient:    Jillian Taylor, Jillian Taylor                    MRN: [id number] Proc. Date: 05/29/00 Attending:  Anselmo Rod, M.D. CC:         Merlene Laughter. Renae Gloss, M.D.                          Consultation Report  DATE OF BIRTH:  10-01-1951  REFERRING PHYSICIAN:  Merlene Laughter. Renae Gloss, M.D.  CONSULTING PHYSICIAN:  Anselmo Rod, M.D.  REASON FOR CONSULTATION:  Rectal bleeding.  ASSESSMENT: 1. Painless rectal bleeding, rule out diverticular bleed, polyps, masses,    hemorrhoids, etc. 2. History of constipation off and on. 3. History of iritis.  The patient is legally blind and on multiple    ophthalmic drops. 4. History of laparoscopic evaluation of her fallopian tubes in the remote    past. 5. History of laparoscopic cholecystectomy in 1996. 6. History of tobacco abuse. 7. Family history of "rectal cancer." 8. Microcytic anemia.  RECOMMENDATIONS: 1. Colonoscopy on an outpatient basis. 2. Agree with serial CBCs on an outpatient basis within this week. 3. Further recommendations to be made after the procedure.  The patient is    advised to avoid all nonsteroidals for now and see me in the office on    June 04, 2000 to be set up for a colonoscopy. 4. Iron studies to be done today.  DISCUSSION:  Jillian Taylor is a 60 year old black female with the above mentioned problems who presented to the [facility] Emergency Room with a history of rectal bleeding on one occasion. She says she had rectal bleeding off and on for years and thinks this may be due to hemorrhoids but she thought the episode she had yesterday morning was "more than usual" prompting her to come to the emergency room.  The patient had another bout of rectal bleeding in the emergency room and has been stable since then. She noticed some blood in her stool during a bowel movement late yesterday but has had no evidence of ongoing bleeding since yesterday.  The patient  denies any abdominal pain. There is no history of nausea, vomiting or diarrhea. Her appetite has been fairly good.  She denies abnormal weight loss or weight gain.  There is no history of ulcers, jaundice or colitis.  She gives a history of nervous stomach in high school but has no problems with that since then. There is no history of reflux, dysphagia, odynophagia or blood transfusions.  She has no history of tattoos.  There are no genitourinary or cardiorespiratory problems. She is a [place] 2, Para 2, Abortus 0. Her last Pap smear accordingly to her was slightly abnormal and she is due for another one soon. Her mammograms have been up to date and normal.  There are no musculoskeletal, vascular or neurologic problems.  She gives a history of occasional headaches which she attributes to her sinuses but denies dizziness or syncope.  She has had known dizzy spells with this bout of rectal bleeding.  There is no history of ENT or dental problems. She has partials in the upper and lower jaw.  She is allergic to penicillin. She is presently on Prednisone ophthalmic drops and Acular ophthalmic drops.  She is not taking any other medications on a regular basis.  PAST MEDICAL HISTORY: See list above.  PERSONAL HISTORY: She  is single and lives with her 59 year old son in [place]. She is on disability since early 1990 because of her iritis.  She smokes a pack per day for the last nine years.  She denies the use of alcohol or street drugs.  FAMILY HISTORY:  Her father is 3 and has history of bypass surgery and had complications after that.  Her mother is 74 and has history of angina and diabetes.  She also suffers from hypertension. There is a history of rectal cancer in a maternal aunt and she has four brothers and fours sisters all of whom are healthy.  REVIEW OF SYSTEMS: 1. Rectal bleeding. 2. Legally blind. 3. Headaches. 4. No history of cardiorespiratory, genitourinary problems.   No nausea,    vomiting, or diarrhea. 5. Occasional constipation.  PHYSICAL EXAMINATION:  General physical examination reveals a middle aged black female, obese lying comfortably in bed in no acute distress. Temperature is 97.3, blood pressure 120/86, pulse 76 per minute, respiratory rate 16.  HEENT:  Atraumatic and normocephalic.  EOMI.  The patient is wearing glasses.  She has partials in the upper and lower jaws.  Oropharyngeal mucosa without exudate.  Neck supple.  No JVD, thyromegaly or lymphadenopathy. Chest: Clear to auscultation. S1 and S2 regular. No murmur, rub or gallop.  No rales, rhonchi or wheezing.  Abdomen:  Obese with laparoscopic scars present from previous surgery. Nontender with normal abdominal bowel sounds.  Rectal examination deferred as the patient already had rectal examination done yesterday which showed moderate tone with frank blood one examining finger.  LABORATORY DATA:  On admission revealed hemoglobin of 11.6, white count 8.3, platelet count 216,000.  PT 13.6, INR 1.1. Basic metabolic panel was normal and so were the CK MBs.  On May 27, 2000 the patient had an MCV of 70.9. Hemoglobin was 12.0.  PTT 35. As of today, hemoglobin is 11.1, white cell count 7.3, platelets 190,000.  Considering this data, plans are to do a colonoscopy.  If this is unrevealing an EGD will be done considering her microcytic condition. Iron studies have been ordered and further recommendations will be made thereafter.  Iron studies to be done today.  Thank you for the consultation. DD:  05/29/00 TD:  05/29/00 Job: [id number]
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
## 2011-05-12 NOTE — Discharge Summary (Signed)
[facility]  Patient:    KATHLEEN, TAMM                       MRN: [id number] Adm. Date:  05/28/00 Disc. Date: 05/29/00 Attending:  Merlene Laughter. Renae Gloss, M.D.                           Discharge Summary  DISCHARGE DIAGNOSIS:  Rectal bleeding.  HOSPITAL COURSE:  Ms. Nieland was admitted after several episodes of gross rectal bleeding.  She denied abdominal pain, nausea or vomiting.  She has had constipation, but no history of diarrhea.  She states that she has had previous episodes of rectal bleeding, however, not to the extent that she had on admission.  Her hemoglobin remained stable and she was hemodynamically stable as well.  She was seen by Dr. Charna Elizabeth for GI consultation.  It was felt that since she had been stable over 48 hours since her episode of rectal bleeding that further GI workup could be pursued as an outpatient.  She will require a colonoscopy and/or an EGD which has been scheduled by Dr. Loreta Ave.  DISPOSITION:  Upon discharge, Ms. Michaelson was tolerating oral intake without complications and ambulating.  Ms. Manders was instructed to call, either myself or Dr. Loreta Ave, as soon as possible if rectal bleeding recurred. She was also advised to discontinue any aspirin products.  DISCHARGE MEDICATIONS:  Eye drops as previously directed.  FOLLOW UP:  As per Dr. Loreta Ave for colonoscopy and/or an EGD. DD:  06/28/00 TD:  06/28/00 Job: [id number]
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
## 2011-05-12 NOTE — Op Note (Signed)
NAMERETHER, RISON NO.:  [id number]   MEDICAL RECORD NO.:  [id number]          PATIENT TYPE:  AMB   LOCATION:  ENDO                         FACILITY:  [facility]   PHYSICIAN:  Anselmo Rod, M.D.  DATE OF BIRTH:  [birth year]   DATE OF PROCEDURE:  07/31/2005  DATE OF DISCHARGE:                                 OPERATIVE REPORT   PROCEDURE PERFORMED:  Colonoscopy with cold biopsies.   ENDOSCOPIST:  Charna Elizabeth, M.D.   INSTRUMENT USED:  Olympus video colonoscope.   INDICATIONS FOR PROCEDURE:  A 60 year old African-American female with  family history of colon cancer and change in bowel habits with mild anemia  undergoing colonoscopy to rule out colonic polyps, masses, etc.   PREPROCEDURE PREPARATION:  Informed consent was procured from the patient.  The patient was fasted for eight hours prior to the procedure and prepped  with a bottle of magnesium citrate and a gallon of GoLYTELY the night prior  to the procedure.  The risks and benefits of the procedure including a 10%  miss rate for cancers and polyps was discussed with the patient as well.   PREPROCEDURE PHYSICAL:  The patient had stable vital signs.  Neck supple.  Chest clear to auscultation.  S1 and S2 regular.  Abdomen soft with normal  bowel sounds.   DESCRIPTION OF PROCEDURE:  The patient was placed in left lateral decubitus  position and sedated with 100 mg of Demerol and 10 mg of Versed in slow  incremental doses.  Once the patient was adequately sedated and maintained  on low flow oxygen and continuous cardiac monitoring, the Olympus video  colonoscope was advanced from the rectum to the cecum.  The appendicular  orifice and ileocecal valve were clearly visualized after multiple washes.  The patient's position was changed from the left lateral to the supine and  right lateral position with gentle application of abdominal pressure to  reach the cecum.  The prep was somewhat poor.  Four small sessile  polyps  were biopsied from the rectosigmoid colon (cold biopsies).  There was mild  inflammatory change noted around the appendicular orifice and this was  biopsied as well.  The terminal ileum appeared healthy and without lesions.  Retroflexion in the rectum revealed small internal hemorrhoids.  The patient  tolerated the procedure well without complication.   IMPRESSION:  1.Small nonbleeding internal hemorrhoids.  2.Four small sessile polyps were removed by cold biopsies from the  rectosigmoid colon.  3.Mild inflammatory changes were noted around the appendicular orifice,  biopsies done, results pending.  4.Normal terminal ileum.  5.There was some residual stool in the colon and multiple washes were done.  Small lesions could have been missed.   RECOMMENDATIONS:  1.Await pathology results.  2.Avoid all nonsteroidals including aspirin for the next two weeks.  3.Outpatient followup as need arises in the future.  Further recommendations  will be made depending on biopsy results.       JNM/MEDQ  D:  07/31/2005  T:  07/31/2005  Job:  [id number]   cc:   Maurice March, M.D.  [address]  [address]  [place]  [place] [address]  Fax: [phone]

## 2011-05-29 ENCOUNTER — Encounter: Payer: Medicare Other | Admitting: Physical Therapy

## 2011-09-18 LAB — CBC
HCT: 36.8
Hemoglobin: 12
MCHC: 32.5
MCV: 70.6 — ABNORMAL LOW
Platelets: 245
RBC: 5.22 — ABNORMAL HIGH
RDW: 14.1
WBC: 12.8 — ABNORMAL HIGH

## 2011-09-18 LAB — BASIC METABOLIC PANEL
BUN: 12
CO2: 26
Calcium: 9.1
Chloride: 101
Creatinine, Ser: 0.69
GFR calc Af Amer: >60
GFR calc non Af Amer: >60
Glucose, Bld: 101 — ABNORMAL HIGH
Potassium: 4
Sodium: 135

## 2011-09-19 LAB — URINALYSIS, ROUTINE W REFLEX MICROSCOPIC
Bilirubin Urine: NEGATIVE
Glucose, UA: NEGATIVE
Hgb urine dipstick: NEGATIVE
Ketones, ur: NEGATIVE
Nitrite: NEGATIVE
Protein, ur: NEGATIVE
Specific Gravity, Urine: 1.012
Urobilinogen, UA: 0.2
pH: 6.5

## 2011-09-19 LAB — DIFFERENTIAL
Basophils Absolute: 0
Basophils Relative: 0
Eosinophils Absolute: 0.2
Eosinophils Relative: 2
Lymphocytes Relative: 23
Lymphs Abs: 2.6
Monocytes Absolute: 0.9
Monocytes Relative: 8
Neutro Abs: 7.6
Neutrophils Relative %: 68

## 2011-09-19 LAB — POCT CARDIAC MARKERS
CKMB, poc: <1 — ABNORMAL LOW
Myoglobin, poc: 47.9
Operator id: 282201
Troponin i, poc: <0.05

## 2011-09-19 LAB — POCT I-STAT, CHEM 8
BUN: 11
Calcium, Ion: 1.19
Chloride: 104
Creatinine, Ser: 0.9
Glucose, Bld: 103 — ABNORMAL HIGH
HCT: 37
Hemoglobin: 12.6
Potassium: 4
Sodium: 139
TCO2: 27

## 2011-09-19 LAB — CBC
HCT: 34.7 — ABNORMAL LOW
Hemoglobin: 11.6 — ABNORMAL LOW
MCHC: 33.3
MCV: 70.7 — ABNORMAL LOW
Platelets: 223
RBC: 4.91
RDW: 13.8
WBC: 11.3 — ABNORMAL HIGH

## 2011-09-29 LAB — POCT I-STAT, CHEM 8
BUN: 9 mg/dL (ref 6–23)
Calcium, Ion: 1.13 mmol/L (ref 1.12–1.32)
Chloride: 107 mEq/L (ref 96–112)
Creatinine, Ser: 0.9 mg/dL (ref 0.4–1.2)
Glucose, Bld: 84 mg/dL (ref 70–99)
HCT: 36 % (ref 36.0–46.0)
Hemoglobin: 12.2 g/dL (ref 12.0–15.0)
Potassium: 3.7 mEq/L (ref 3.5–5.1)
Sodium: 142 mEq/L (ref 135–145)
TCO2: 27 mmol/L (ref 0–100)

## 2011-09-29 LAB — DIFFERENTIAL
Basophils Absolute: 0 10*3/uL (ref 0.0–0.1)
Basophils Relative: 0 % (ref 0–1)
Eosinophils Absolute: 0.1 10*3/uL (ref 0.0–0.7)
Eosinophils Relative: 1 % (ref 0–5)
Lymphocytes Relative: 23 % (ref 12–46)
Lymphs Abs: 2.1 10*3/uL (ref 0.7–4.0)
Monocytes Absolute: 0.6 10*3/uL (ref 0.1–1.0)
Monocytes Relative: 7 % (ref 3–12)
Neutro Abs: 6 10*3/uL (ref 1.7–7.7)
Neutrophils Relative %: 68 % (ref 43–77)

## 2011-09-29 LAB — POCT CARDIAC MARKERS
CKMB, poc: <1 ng/mL — ABNORMAL LOW (ref 1.0–8.0)
CKMB, poc: <1 ng/mL — ABNORMAL LOW (ref 1.0–8.0)
Myoglobin, poc: 37.9 ng/mL (ref 12–200)
Myoglobin, poc: 51 ng/mL (ref 12–200)
Troponin i, poc: <0.05 ng/mL (ref 0.00–0.09)
Troponin i, poc: <0.05 ng/mL (ref 0.00–0.09)

## 2011-09-29 LAB — CBC
HCT: 36.3 % (ref 36.0–46.0)
Hemoglobin: 11.4 g/dL — ABNORMAL LOW (ref 12.0–15.0)
MCHC: 31.6 g/dL (ref 30.0–36.0)
MCV: 70.8 fL — ABNORMAL LOW (ref 78.0–100.0)
Platelets: 221 10*3/uL (ref 150–400)
RBC: 5.13 MIL/uL — ABNORMAL HIGH (ref 3.87–5.11)
RDW: 15.6 % — ABNORMAL HIGH (ref 11.5–15.5)
WBC: 8.9 10*3/uL (ref 4.0–10.5)

## 2011-12-08 DIAGNOSIS — M87059 Idiopathic aseptic necrosis of unspecified femur: Secondary | ICD-10-CM

## 2011-12-08 HISTORY — DX: Morbid (severe) obesity due to excess calories: E66.01

## 2011-12-08 HISTORY — DX: Idiopathic aseptic necrosis of unspecified femur: M87.059

## 2011-12-28 ENCOUNTER — Other Ambulatory Visit: Payer: Self-pay | Admitting: Family Medicine

## 2011-12-28 DIAGNOSIS — N390 Urinary tract infection, site not specified: Secondary | ICD-10-CM | POA: Diagnosis not present

## 2011-12-28 DIAGNOSIS — N63 Unspecified lump in unspecified breast: Secondary | ICD-10-CM

## 2012-01-10 ENCOUNTER — Ambulatory Visit
Admission: RE | Admit: 2012-01-10 | Discharge: 2012-01-10 | Disposition: A | Discharge status: Home / Self Care | Payer: Medicare Other | Source: Ambulatory Visit | Attending: Family Medicine | Admitting: Family Medicine

## 2012-01-10 DIAGNOSIS — N63 Unspecified lump in unspecified breast: Secondary | ICD-10-CM

## 2012-01-10 DIAGNOSIS — N6489 Other specified disorders of breast: Secondary | ICD-10-CM | POA: Diagnosis not present

## 2012-01-17 DIAGNOSIS — E669 Obesity, unspecified: Secondary | ICD-10-CM | POA: Diagnosis not present

## 2012-01-24 DIAGNOSIS — R279 Unspecified lack of coordination: Secondary | ICD-10-CM | POA: Diagnosis not present

## 2012-01-24 DIAGNOSIS — IMO0001 Reserved for inherently not codable concepts without codable children: Secondary | ICD-10-CM | POA: Diagnosis not present

## 2012-01-24 DIAGNOSIS — R262 Difficulty in walking, not elsewhere classified: Secondary | ICD-10-CM | POA: Diagnosis not present

## 2012-01-24 DIAGNOSIS — M8708 Idiopathic aseptic necrosis of bone, other site: Secondary | ICD-10-CM | POA: Diagnosis not present

## 2012-01-24 DIAGNOSIS — R269 Unspecified abnormalities of gait and mobility: Secondary | ICD-10-CM | POA: Diagnosis not present

## 2012-01-24 DIAGNOSIS — M6281 Muscle weakness (generalized): Secondary | ICD-10-CM | POA: Diagnosis not present

## 2012-01-24 DIAGNOSIS — M25659 Stiffness of unspecified hip, not elsewhere classified: Secondary | ICD-10-CM | POA: Diagnosis not present

## 2012-01-24 DIAGNOSIS — R293 Abnormal posture: Secondary | ICD-10-CM | POA: Diagnosis not present

## 2012-01-24 DIAGNOSIS — M25559 Pain in unspecified hip: Secondary | ICD-10-CM | POA: Diagnosis not present

## 2012-01-30 DIAGNOSIS — R293 Abnormal posture: Secondary | ICD-10-CM | POA: Diagnosis not present

## 2012-01-30 DIAGNOSIS — R279 Unspecified lack of coordination: Secondary | ICD-10-CM | POA: Diagnosis not present

## 2012-01-30 DIAGNOSIS — M8708 Idiopathic aseptic necrosis of bone, other site: Secondary | ICD-10-CM | POA: Diagnosis not present

## 2012-01-30 DIAGNOSIS — IMO0001 Reserved for inherently not codable concepts without codable children: Secondary | ICD-10-CM | POA: Diagnosis not present

## 2012-01-30 DIAGNOSIS — M6281 Muscle weakness (generalized): Secondary | ICD-10-CM | POA: Diagnosis not present

## 2012-01-30 DIAGNOSIS — R262 Difficulty in walking, not elsewhere classified: Secondary | ICD-10-CM | POA: Diagnosis not present

## 2012-01-30 DIAGNOSIS — R269 Unspecified abnormalities of gait and mobility: Secondary | ICD-10-CM | POA: Diagnosis not present

## 2012-01-30 DIAGNOSIS — M25659 Stiffness of unspecified hip, not elsewhere classified: Secondary | ICD-10-CM | POA: Diagnosis not present

## 2012-01-30 DIAGNOSIS — M25559 Pain in unspecified hip: Secondary | ICD-10-CM | POA: Diagnosis not present

## 2012-02-02 DIAGNOSIS — M87059 Idiopathic aseptic necrosis of unspecified femur: Secondary | ICD-10-CM | POA: Diagnosis not present

## 2012-02-28 DIAGNOSIS — R293 Abnormal posture: Secondary | ICD-10-CM | POA: Diagnosis not present

## 2012-02-28 DIAGNOSIS — IMO0001 Reserved for inherently not codable concepts without codable children: Secondary | ICD-10-CM | POA: Diagnosis not present

## 2012-02-28 DIAGNOSIS — M8708 Idiopathic aseptic necrosis of bone, other site: Secondary | ICD-10-CM | POA: Diagnosis not present

## 2012-02-28 DIAGNOSIS — R279 Unspecified lack of coordination: Secondary | ICD-10-CM | POA: Diagnosis not present

## 2012-02-28 DIAGNOSIS — R262 Difficulty in walking, not elsewhere classified: Secondary | ICD-10-CM | POA: Diagnosis not present

## 2012-02-28 DIAGNOSIS — M25659 Stiffness of unspecified hip, not elsewhere classified: Secondary | ICD-10-CM | POA: Diagnosis not present

## 2012-02-28 DIAGNOSIS — M25559 Pain in unspecified hip: Secondary | ICD-10-CM | POA: Diagnosis not present

## 2012-02-28 DIAGNOSIS — M6281 Muscle weakness (generalized): Secondary | ICD-10-CM | POA: Diagnosis not present

## 2012-02-28 DIAGNOSIS — R269 Unspecified abnormalities of gait and mobility: Secondary | ICD-10-CM | POA: Diagnosis not present

## 2012-03-01 DIAGNOSIS — IMO0001 Reserved for inherently not codable concepts without codable children: Secondary | ICD-10-CM | POA: Diagnosis not present

## 2012-03-01 DIAGNOSIS — R293 Abnormal posture: Secondary | ICD-10-CM | POA: Diagnosis not present

## 2012-03-01 DIAGNOSIS — M25659 Stiffness of unspecified hip, not elsewhere classified: Secondary | ICD-10-CM | POA: Diagnosis not present

## 2012-03-01 DIAGNOSIS — M25559 Pain in unspecified hip: Secondary | ICD-10-CM | POA: Diagnosis not present

## 2012-03-01 DIAGNOSIS — M8708 Idiopathic aseptic necrosis of bone, other site: Secondary | ICD-10-CM | POA: Diagnosis not present

## 2012-03-01 DIAGNOSIS — R279 Unspecified lack of coordination: Secondary | ICD-10-CM | POA: Diagnosis not present

## 2012-03-04 DIAGNOSIS — R293 Abnormal posture: Secondary | ICD-10-CM | POA: Diagnosis not present

## 2012-03-04 DIAGNOSIS — IMO0001 Reserved for inherently not codable concepts without codable children: Secondary | ICD-10-CM | POA: Diagnosis not present

## 2012-03-04 DIAGNOSIS — M8708 Idiopathic aseptic necrosis of bone, other site: Secondary | ICD-10-CM | POA: Diagnosis not present

## 2012-03-04 DIAGNOSIS — R279 Unspecified lack of coordination: Secondary | ICD-10-CM | POA: Diagnosis not present

## 2012-03-04 DIAGNOSIS — M25659 Stiffness of unspecified hip, not elsewhere classified: Secondary | ICD-10-CM | POA: Diagnosis not present

## 2012-03-04 DIAGNOSIS — M25559 Pain in unspecified hip: Secondary | ICD-10-CM | POA: Diagnosis not present

## 2012-03-07 DIAGNOSIS — R05 Cough: Secondary | ICD-10-CM | POA: Diagnosis not present

## 2012-03-07 DIAGNOSIS — R059 Cough, unspecified: Secondary | ICD-10-CM | POA: Diagnosis not present

## 2012-03-07 DIAGNOSIS — J019 Acute sinusitis, unspecified: Secondary | ICD-10-CM | POA: Diagnosis not present

## 2012-03-11 DIAGNOSIS — M8708 Idiopathic aseptic necrosis of bone, other site: Secondary | ICD-10-CM | POA: Diagnosis not present

## 2012-03-11 DIAGNOSIS — R279 Unspecified lack of coordination: Secondary | ICD-10-CM | POA: Diagnosis not present

## 2012-03-11 DIAGNOSIS — M25659 Stiffness of unspecified hip, not elsewhere classified: Secondary | ICD-10-CM | POA: Diagnosis not present

## 2012-03-11 DIAGNOSIS — M25559 Pain in unspecified hip: Secondary | ICD-10-CM | POA: Diagnosis not present

## 2012-03-11 DIAGNOSIS — IMO0001 Reserved for inherently not codable concepts without codable children: Secondary | ICD-10-CM | POA: Diagnosis not present

## 2012-03-11 DIAGNOSIS — R293 Abnormal posture: Secondary | ICD-10-CM | POA: Diagnosis not present

## 2012-03-13 DIAGNOSIS — R293 Abnormal posture: Secondary | ICD-10-CM | POA: Diagnosis not present

## 2012-03-13 DIAGNOSIS — M25659 Stiffness of unspecified hip, not elsewhere classified: Secondary | ICD-10-CM | POA: Diagnosis not present

## 2012-03-13 DIAGNOSIS — M8708 Idiopathic aseptic necrosis of bone, other site: Secondary | ICD-10-CM | POA: Diagnosis not present

## 2012-03-13 DIAGNOSIS — R279 Unspecified lack of coordination: Secondary | ICD-10-CM | POA: Diagnosis not present

## 2012-03-13 DIAGNOSIS — M25559 Pain in unspecified hip: Secondary | ICD-10-CM | POA: Diagnosis not present

## 2012-03-13 DIAGNOSIS — IMO0001 Reserved for inherently not codable concepts without codable children: Secondary | ICD-10-CM | POA: Diagnosis not present

## 2012-03-15 DIAGNOSIS — H33009 Unspecified retinal detachment with retinal break, unspecified eye: Secondary | ICD-10-CM | POA: Diagnosis not present

## 2012-03-18 DIAGNOSIS — M8708 Idiopathic aseptic necrosis of bone, other site: Secondary | ICD-10-CM | POA: Diagnosis not present

## 2012-03-18 DIAGNOSIS — IMO0001 Reserved for inherently not codable concepts without codable children: Secondary | ICD-10-CM | POA: Diagnosis not present

## 2012-03-18 DIAGNOSIS — M25659 Stiffness of unspecified hip, not elsewhere classified: Secondary | ICD-10-CM | POA: Diagnosis not present

## 2012-03-18 DIAGNOSIS — R293 Abnormal posture: Secondary | ICD-10-CM | POA: Diagnosis not present

## 2012-03-18 DIAGNOSIS — M25559 Pain in unspecified hip: Secondary | ICD-10-CM | POA: Diagnosis not present

## 2012-03-18 DIAGNOSIS — R279 Unspecified lack of coordination: Secondary | ICD-10-CM | POA: Diagnosis not present

## 2012-05-07 DIAGNOSIS — E559 Vitamin D deficiency, unspecified: Secondary | ICD-10-CM | POA: Diagnosis not present

## 2012-05-07 DIAGNOSIS — E538 Deficiency of other specified B group vitamins: Secondary | ICD-10-CM | POA: Diagnosis not present

## 2012-05-07 DIAGNOSIS — D649 Anemia, unspecified: Secondary | ICD-10-CM | POA: Diagnosis not present

## 2012-05-07 DIAGNOSIS — R109 Unspecified abdominal pain: Secondary | ICD-10-CM | POA: Diagnosis not present

## 2012-05-22 DIAGNOSIS — J309 Allergic rhinitis, unspecified: Secondary | ICD-10-CM | POA: Diagnosis not present

## 2012-08-22 DIAGNOSIS — M5430 Sciatica, unspecified side: Secondary | ICD-10-CM | POA: Diagnosis not present

## 2012-08-22 DIAGNOSIS — M25569 Pain in unspecified knee: Secondary | ICD-10-CM | POA: Diagnosis not present

## 2012-09-16 DIAGNOSIS — H33009 Unspecified retinal detachment with retinal break, unspecified eye: Secondary | ICD-10-CM | POA: Diagnosis not present

## 2012-09-18 DIAGNOSIS — R21 Rash and other nonspecific skin eruption: Secondary | ICD-10-CM | POA: Diagnosis not present

## 2012-10-02 DIAGNOSIS — J45901 Unspecified asthma with (acute) exacerbation: Secondary | ICD-10-CM | POA: Diagnosis not present

## 2012-10-02 DIAGNOSIS — J309 Allergic rhinitis, unspecified: Secondary | ICD-10-CM | POA: Diagnosis not present

## 2012-10-16 DIAGNOSIS — Z1231 Encounter for screening mammogram for malignant neoplasm of breast: Secondary | ICD-10-CM | POA: Diagnosis not present

## 2012-10-21 DIAGNOSIS — R110 Nausea: Secondary | ICD-10-CM | POA: Diagnosis not present

## 2012-10-21 DIAGNOSIS — R42 Dizziness and giddiness: Secondary | ICD-10-CM | POA: Diagnosis not present

## 2012-10-21 DIAGNOSIS — R82998 Other abnormal findings in urine: Secondary | ICD-10-CM | POA: Diagnosis not present

## 2012-10-22 DIAGNOSIS — H43819 Vitreous degeneration, unspecified eye: Secondary | ICD-10-CM | POA: Diagnosis not present

## 2012-10-25 DIAGNOSIS — H33009 Unspecified retinal detachment with retinal break, unspecified eye: Secondary | ICD-10-CM | POA: Diagnosis not present

## 2012-12-02 DIAGNOSIS — H33009 Unspecified retinal detachment with retinal break, unspecified eye: Secondary | ICD-10-CM | POA: Diagnosis not present

## 2013-03-03 DIAGNOSIS — R42 Dizziness and giddiness: Secondary | ICD-10-CM | POA: Diagnosis not present

## 2013-03-03 DIAGNOSIS — J019 Acute sinusitis, unspecified: Secondary | ICD-10-CM | POA: Diagnosis not present

## 2013-03-03 DIAGNOSIS — Z6836 Body mass index (BMI) 36.0-36.9, adult: Secondary | ICD-10-CM | POA: Diagnosis not present

## 2013-03-04 DIAGNOSIS — H33009 Unspecified retinal detachment with retinal break, unspecified eye: Secondary | ICD-10-CM | POA: Diagnosis not present

## 2013-04-10 DIAGNOSIS — E559 Vitamin D deficiency, unspecified: Secondary | ICD-10-CM | POA: Diagnosis not present

## 2013-04-10 DIAGNOSIS — Z136 Encounter for screening for cardiovascular disorders: Secondary | ICD-10-CM | POA: Diagnosis not present

## 2013-04-10 DIAGNOSIS — Z6836 Body mass index (BMI) 36.0-36.9, adult: Secondary | ICD-10-CM | POA: Diagnosis not present

## 2013-04-10 DIAGNOSIS — Z79899 Other long term (current) drug therapy: Secondary | ICD-10-CM | POA: Diagnosis not present

## 2013-04-10 DIAGNOSIS — D649 Anemia, unspecified: Secondary | ICD-10-CM | POA: Diagnosis not present

## 2013-04-10 DIAGNOSIS — E538 Deficiency of other specified B group vitamins: Secondary | ICD-10-CM | POA: Diagnosis not present

## 2013-04-10 DIAGNOSIS — Z1331 Encounter for screening for depression: Secondary | ICD-10-CM | POA: Diagnosis not present

## 2013-05-23 DIAGNOSIS — H4310 Vitreous hemorrhage, unspecified eye: Secondary | ICD-10-CM | POA: Diagnosis not present

## 2013-07-04 ENCOUNTER — Emergency Department: Payer: Self-pay | Admitting: Emergency Medicine

## 2013-07-04 DIAGNOSIS — H209 Unspecified iridocyclitis: Secondary | ICD-10-CM | POA: Diagnosis not present

## 2013-07-04 DIAGNOSIS — H2000 Unspecified acute and subacute iridocyclitis: Secondary | ICD-10-CM | POA: Diagnosis not present

## 2013-07-04 DIAGNOSIS — H5710 Ocular pain, unspecified eye: Secondary | ICD-10-CM | POA: Diagnosis not present

## 2013-07-04 DIAGNOSIS — H539 Unspecified visual disturbance: Secondary | ICD-10-CM | POA: Diagnosis not present

## 2013-07-18 DIAGNOSIS — H04129 Dry eye syndrome of unspecified lacrimal gland: Secondary | ICD-10-CM | POA: Diagnosis not present

## 2013-10-15 DIAGNOSIS — E559 Vitamin D deficiency, unspecified: Secondary | ICD-10-CM | POA: Diagnosis not present

## 2013-10-15 DIAGNOSIS — M25559 Pain in unspecified hip: Secondary | ICD-10-CM | POA: Diagnosis not present

## 2013-10-15 DIAGNOSIS — Z124 Encounter for screening for malignant neoplasm of cervix: Secondary | ICD-10-CM | POA: Diagnosis not present

## 2013-10-15 DIAGNOSIS — F411 Generalized anxiety disorder: Secondary | ICD-10-CM | POA: Diagnosis not present

## 2013-10-15 DIAGNOSIS — Z0000 Encounter for general adult medical examination without abnormal findings: Secondary | ICD-10-CM | POA: Diagnosis not present

## 2013-10-17 DIAGNOSIS — H3090 Unspecified chorioretinal inflammation, unspecified eye: Secondary | ICD-10-CM | POA: Diagnosis not present

## 2013-10-29 DIAGNOSIS — Z13820 Encounter for screening for osteoporosis: Secondary | ICD-10-CM | POA: Diagnosis not present

## 2013-10-29 DIAGNOSIS — N951 Menopausal and female climacteric states: Secondary | ICD-10-CM | POA: Diagnosis not present

## 2013-10-29 DIAGNOSIS — Z1231 Encounter for screening mammogram for malignant neoplasm of breast: Secondary | ICD-10-CM | POA: Diagnosis not present

## 2014-01-07 DIAGNOSIS — Z9181 History of falling: Secondary | ICD-10-CM | POA: Diagnosis not present

## 2014-01-07 DIAGNOSIS — M25559 Pain in unspecified hip: Secondary | ICD-10-CM | POA: Diagnosis not present

## 2014-01-07 DIAGNOSIS — Z1331 Encounter for screening for depression: Secondary | ICD-10-CM | POA: Diagnosis not present

## 2014-01-07 DIAGNOSIS — IMO0002 Reserved for concepts with insufficient information to code with codable children: Secondary | ICD-10-CM | POA: Diagnosis not present

## 2014-01-12 DIAGNOSIS — J45909 Unspecified asthma, uncomplicated: Secondary | ICD-10-CM | POA: Diagnosis not present

## 2014-01-13 DIAGNOSIS — M25559 Pain in unspecified hip: Secondary | ICD-10-CM | POA: Diagnosis not present

## 2014-01-13 DIAGNOSIS — M5137 Other intervertebral disc degeneration, lumbosacral region: Secondary | ICD-10-CM | POA: Diagnosis not present

## 2014-01-13 DIAGNOSIS — M48061 Spinal stenosis, lumbar region without neurogenic claudication: Secondary | ICD-10-CM | POA: Diagnosis not present

## 2014-01-16 DIAGNOSIS — H33009 Unspecified retinal detachment with retinal break, unspecified eye: Secondary | ICD-10-CM | POA: Diagnosis not present

## 2014-01-28 DIAGNOSIS — M87059 Idiopathic aseptic necrosis of unspecified femur: Secondary | ICD-10-CM | POA: Diagnosis not present

## 2014-01-28 DIAGNOSIS — M4310 Spondylolisthesis, site unspecified: Secondary | ICD-10-CM | POA: Diagnosis not present

## 2014-03-19 DIAGNOSIS — M25559 Pain in unspecified hip: Secondary | ICD-10-CM | POA: Diagnosis not present

## 2014-03-19 DIAGNOSIS — Z6837 Body mass index (BMI) 37.0-37.9, adult: Secondary | ICD-10-CM | POA: Diagnosis not present

## 2014-04-02 DIAGNOSIS — M25559 Pain in unspecified hip: Secondary | ICD-10-CM | POA: Diagnosis not present

## 2014-05-12 DIAGNOSIS — R5383 Other fatigue: Secondary | ICD-10-CM | POA: Diagnosis not present

## 2014-05-12 DIAGNOSIS — D649 Anemia, unspecified: Secondary | ICD-10-CM | POA: Diagnosis not present

## 2014-05-12 DIAGNOSIS — R5381 Other malaise: Secondary | ICD-10-CM | POA: Diagnosis not present

## 2014-05-12 DIAGNOSIS — E538 Deficiency of other specified B group vitamins: Secondary | ICD-10-CM | POA: Diagnosis not present

## 2014-05-12 DIAGNOSIS — Z136 Encounter for screening for cardiovascular disorders: Secondary | ICD-10-CM | POA: Diagnosis not present

## 2014-05-12 DIAGNOSIS — E559 Vitamin D deficiency, unspecified: Secondary | ICD-10-CM | POA: Diagnosis not present

## 2014-05-12 DIAGNOSIS — R300 Dysuria: Secondary | ICD-10-CM | POA: Diagnosis not present

## 2014-06-15 DIAGNOSIS — Z86010 Personal history of colonic polyps: Secondary | ICD-10-CM | POA: Diagnosis not present

## 2014-06-15 DIAGNOSIS — K5900 Constipation, unspecified: Secondary | ICD-10-CM | POA: Diagnosis not present

## 2014-06-15 DIAGNOSIS — K644 Residual hemorrhoidal skin tags: Secondary | ICD-10-CM | POA: Diagnosis not present

## 2014-07-07 DIAGNOSIS — H543 Unqualified visual loss, both eyes: Secondary | ICD-10-CM | POA: Diagnosis not present

## 2014-07-07 DIAGNOSIS — Z87891 Personal history of nicotine dependence: Secondary | ICD-10-CM | POA: Diagnosis not present

## 2014-07-07 DIAGNOSIS — Z860100 Personal history of colon polyps, unspecified: Secondary | ICD-10-CM | POA: Diagnosis not present

## 2014-07-07 DIAGNOSIS — Z79899 Other long term (current) drug therapy: Secondary | ICD-10-CM | POA: Diagnosis not present

## 2014-07-07 DIAGNOSIS — Z800 Family history of malignant neoplasm of digestive organs: Secondary | ICD-10-CM | POA: Diagnosis not present

## 2014-07-07 DIAGNOSIS — K644 Residual hemorrhoidal skin tags: Secondary | ICD-10-CM | POA: Diagnosis not present

## 2014-07-07 DIAGNOSIS — D649 Anemia, unspecified: Secondary | ICD-10-CM | POA: Diagnosis not present

## 2014-07-07 DIAGNOSIS — J45909 Unspecified asthma, uncomplicated: Secondary | ICD-10-CM | POA: Diagnosis not present

## 2014-07-07 DIAGNOSIS — Z1211 Encounter for screening for malignant neoplasm of colon: Secondary | ICD-10-CM | POA: Diagnosis not present

## 2014-08-14 DIAGNOSIS — H20829 Vogt-Koyanagi syndrome, unspecified eye: Secondary | ICD-10-CM | POA: Diagnosis not present

## 2014-09-14 DIAGNOSIS — R42 Dizziness and giddiness: Secondary | ICD-10-CM | POA: Diagnosis not present

## 2014-09-14 DIAGNOSIS — J309 Allergic rhinitis, unspecified: Secondary | ICD-10-CM | POA: Diagnosis not present

## 2014-10-09 DIAGNOSIS — H3321 Serous retinal detachment, right eye: Secondary | ICD-10-CM | POA: Diagnosis not present

## 2014-11-12 DIAGNOSIS — E559 Vitamin D deficiency, unspecified: Secondary | ICD-10-CM | POA: Diagnosis not present

## 2014-11-12 DIAGNOSIS — Z0000 Encounter for general adult medical examination without abnormal findings: Secondary | ICD-10-CM | POA: Diagnosis not present

## 2014-11-12 DIAGNOSIS — Z6841 Body Mass Index (BMI) 40.0 and over, adult: Secondary | ICD-10-CM | POA: Diagnosis not present

## 2014-11-12 DIAGNOSIS — Z131 Encounter for screening for diabetes mellitus: Secondary | ICD-10-CM | POA: Diagnosis not present

## 2014-11-24 DIAGNOSIS — M5416 Radiculopathy, lumbar region: Secondary | ICD-10-CM | POA: Diagnosis not present

## 2015-01-07 DIAGNOSIS — Z1231 Encounter for screening mammogram for malignant neoplasm of breast: Secondary | ICD-10-CM | POA: Diagnosis not present

## 2015-01-26 ENCOUNTER — Encounter (HOSPITAL_COMMUNITY): Admission: EM | Disposition: A | Discharge status: Home / Self Care | Payer: Self-pay | Attending: Emergency Medicine

## 2015-01-26 ENCOUNTER — Ambulatory Visit (HOSPITAL_COMMUNITY): Payer: Medicare Other

## 2015-01-26 ENCOUNTER — Ambulatory Visit (HOSPITAL_COMMUNITY)
Admission: EM | Admit: 2015-01-26 | Discharge: 2015-01-26 | Disposition: A | Discharge status: Home / Self Care | Payer: Medicare Other | Source: Intra-hospital | Attending: Emergency Medicine | Admitting: Emergency Medicine

## 2015-01-26 ENCOUNTER — Encounter (HOSPITAL_COMMUNITY): Payer: Self-pay | Admitting: Emergency Medicine

## 2015-01-26 DIAGNOSIS — H540 Blindness, both eyes: Secondary | ICD-10-CM | POA: Diagnosis not present

## 2015-01-26 DIAGNOSIS — Z880 Allergy status to penicillin: Secondary | ICD-10-CM | POA: Diagnosis not present

## 2015-01-26 DIAGNOSIS — R531 Weakness: Secondary | ICD-10-CM | POA: Diagnosis not present

## 2015-01-26 DIAGNOSIS — R42 Dizziness and giddiness: Secondary | ICD-10-CM | POA: Diagnosis not present

## 2015-01-26 DIAGNOSIS — R918 Other nonspecific abnormal finding of lung field: Secondary | ICD-10-CM | POA: Diagnosis not present

## 2015-01-26 DIAGNOSIS — R404 Transient alteration of awareness: Secondary | ICD-10-CM | POA: Diagnosis not present

## 2015-01-26 DIAGNOSIS — R110 Nausea: Secondary | ICD-10-CM | POA: Diagnosis not present

## 2015-01-26 HISTORY — DX: Unspecified visual loss: H54.7

## 2015-01-26 LAB — CBC WITH DIFFERENTIAL/PLATELET
Basophils Absolute: 0 10*3/uL (ref 0.0–0.1)
Basophils Relative: 0 % (ref 0–1)
Eosinophils Absolute: 0.1 10*3/uL (ref 0.0–0.7)
Eosinophils Relative: 2 % (ref 0–5)
HCT: 38.5 % (ref 36.0–46.0)
Hemoglobin: 12.4 g/dL (ref 12.0–15.0)
Lymphocytes Relative: 29 % (ref 12–46)
Lymphs Abs: 2.1 10*3/uL (ref 0.7–4.0)
MCH: 22.8 pg — ABNORMAL LOW (ref 26.0–34.0)
MCHC: 32.2 g/dL (ref 30.0–36.0)
MCV: 70.6 fL — ABNORMAL LOW (ref 78.0–100.0)
Monocytes Absolute: 0.4 10*3/uL (ref 0.1–1.0)
Monocytes Relative: 6 % (ref 3–12)
Neutro Abs: 4.8 10*3/uL (ref 1.7–7.7)
Neutrophils Relative %: 63 % (ref 43–77)
Platelets: 181 10*3/uL (ref 150–400)
RBC: 5.45 MIL/uL — ABNORMAL HIGH (ref 3.87–5.11)
RDW: 14.2 % (ref 11.5–15.5)
WBC: 7.4 10*3/uL (ref 4.0–10.5)

## 2015-01-26 LAB — URINALYSIS, ROUTINE W REFLEX MICROSCOPIC
Bilirubin Urine: NEGATIVE
Glucose, UA: NEGATIVE mg/dL
Hgb urine dipstick: NEGATIVE
Ketones, ur: NEGATIVE mg/dL
Leukocytes, UA: NEGATIVE
Nitrite: NEGATIVE
Protein, ur: NEGATIVE mg/dL
Specific Gravity, Urine: 1.006 (ref 1.005–1.030)
Urobilinogen, UA: 0.2 mg/dL (ref 0.0–1.0)
pH: 6 (ref 5.0–8.0)

## 2015-01-26 LAB — COMPREHENSIVE METABOLIC PANEL
ALT: 27 U/L (ref 0–35)
AST: 44 U/L — ABNORMAL HIGH (ref 0–37)
Albumin: 3.6 g/dL (ref 3.5–5.2)
Alkaline Phosphatase: 91 U/L (ref 39–117)
Anion gap: 9 (ref 5–15)
BUN: 11 mg/dL (ref 6–23)
CO2: 24 mmol/L (ref 19–32)
Calcium: 9.1 mg/dL (ref 8.4–10.5)
Chloride: 105 mmol/L (ref 96–112)
Creatinine, Ser: 0.9 mg/dL (ref 0.50–1.10)
GFR calc Af Amer: 78 mL/min — ABNORMAL LOW (ref >90)
GFR calc non Af Amer: 67 mL/min — ABNORMAL LOW (ref >90)
Glucose, Bld: 125 mg/dL — ABNORMAL HIGH (ref 70–99)
Potassium: 4.3 mmol/L (ref 3.5–5.1)
Sodium: 138 mmol/L (ref 135–145)
Total Bilirubin: 0.7 mg/dL (ref 0.3–1.2)
Total Protein: 7.3 g/dL (ref 6.0–8.3)

## 2015-01-26 LAB — TROPONIN I: Troponin I: <0.03 ng/mL (ref <0.031)

## 2015-01-26 SURGERY — LEFT HEART CATHETERIZATION WITH CORONARY ANGIOGRAM

## 2015-01-26 NOTE — ED Notes (Signed)
Pt was given 324mg  of asa FROM ems ,

## 2015-01-26 NOTE — ED Notes (Signed)
Patient transported to X-ray 

## 2015-01-26 NOTE — ED Notes (Signed)
Pt ambulated in the hall approximately 40 ft accompanied by RN. Pt tolerated ambulation well.

## 2015-01-26 NOTE — ED Notes (Signed)
Pt called ems due to feelings of weakness , pt was called a stemi by EMS but cancelled  Upon arrival to ED

## 2015-01-26 NOTE — ED Provider Notes (Signed)
CSN: [id number]     Arrival date & time 01/26/15  1304 History   First MD Initiated Contact with Patient 01/26/15 1310     Chief Complaint  Patient presents with  . Weakness     (Consider location/radiation/quality/duration/timing/severity/associated sxs/prior Treatment) Patient is a 64 y.o. female presenting with weakness.  Weakness Pertinent negatives include no chest pain, no abdominal pain, no headaches and no shortness of breath.   Patient was brought in by EMS as a code STEMI. EMS was concerned about ST elevation inferiorly. Met upon arrival by cardiology who did not see ST elevation EKG. Patient is not had chest pain. She's been feeling weak for the last several days. No fevers. No cough. She's had a somewhat decreased appetite. No dysuria. No abdominal pain. No headache. No confusion. Past Medical History  Diagnosis Date  . Blind    Past Surgical History  Procedure Laterality Date  . Cholecystectomy    . Lung biopsy    . Eye surgery    . Tonsillectomy     History reviewed. No pertinent family history. History  Substance Use Topics  . Smoking status: Never Smoker   . Smokeless tobacco: Not on file  . Alcohol Use: 1.8 oz/week    1 Glasses of wine, 1 Cans of beer, 1 Shots of liquor per week   OB History    No data available     Review of Systems  Constitutional: Positive for appetite change and fatigue. Negative for activity change.  Eyes:       Patient is blind  Respiratory: Negative for chest tightness and shortness of breath.   Cardiovascular: Negative for chest pain and leg swelling.  Gastrointestinal: Negative for nausea, vomiting, abdominal pain and diarrhea.  Genitourinary: Negative for flank pain.  Musculoskeletal: Negative for back pain and neck stiffness.  Skin: Negative for rash.  Neurological: Positive for weakness. Negative for numbness and headaches.  Psychiatric/Behavioral: Negative for behavioral problems.      Allergies  Penicillins  Home  Medications   Prior to Admission medications   Not on File   BP 127/65 mmHg  Pulse 56  Temp(Src) 97.6 F (36.4 C) (Rectal)  Resp 22  SpO2 100% Physical Exam  Constitutional: She is oriented to person, place, and time. She appears well-developed and well-nourished.  HENT:  Head: Normocephalic and atraumatic.  Eyes:  Patient is blind.   Cardiovascular: Normal rate, regular rhythm and normal heart sounds.   No murmur heard. Pulmonary/Chest: Effort normal and breath sounds normal. No respiratory distress. She has no wheezes. She has no rales.  Abdominal: Soft. Bowel sounds are normal. She exhibits no distension. There is no tenderness.  Musculoskeletal: Normal range of motion.  Neurological: She is alert and oriented to person, place, and time. No cranial nerve deficit.  Skin: Skin is warm and dry.  Psychiatric: She has a normal mood and affect. Her speech is normal.  Nursing note and vitals reviewed.   ED Course  Procedures (including critical care time) Labs Review Labs Reviewed  COMPREHENSIVE METABOLIC PANEL - Abnormal; Notable for the following:    Glucose, Bld 125 (*)    AST 44 (*)    GFR calc non Af Amer 67 (*)    GFR calc Af Amer 78 (*)    All other components within normal limits  CBC WITH DIFFERENTIAL/PLATELET - Abnormal; Notable for the following:    RBC 5.45 (*)    MCV 70.6 (*)    MCH 22.8 (*)  All other components within normal limits  URINALYSIS, ROUTINE W REFLEX MICROSCOPIC  TROPONIN I    Imaging Review Dg Chest 2 View  01/26/2015   CLINICAL DATA:  Weakness and dizziness for 4-5 days. Nausea. No known heart disease, hypertension, or diabetes. Known lung nodules.  EXAM: CHEST  2 VIEW  COMPARISON:  05/26/2010  FINDINGS: Multiple large and small dense pulmonary nodules are identified, grossly unchanged since the prior study. No new consolidations are identified. No pulmonary edema.  IMPRESSION: 1. Stable multiple pulmonary nodules. 2.  No evidence for acute  cardiopulmonary abnormality.   Electronically Signed   By: Shon Hale M.D.   On: 01/26/2015 14:15     EKG Interpretation   Date/Time:  Tuesday January 26 2015 13:10:27 EST Ventricular Rate:  78 PR Interval:  147 QRS Duration: 81 QT Interval:  381 QTC Calculation: 434 R Axis:   51 Text Interpretation:  Sinus rhythm Abnormal R-wave progression, early  transition No significant change since last tracing Confirmed by Johanan Skorupski   MD, Ovid Curd [phone]) on 01/26/2015 4:02:57 PM      MDM   Final diagnoses:  Generalized weakness    Patient presented as code STEMI Canceled once it was determined there was no ST elevation, or chest pain. Lab work reassuring. EKG reassuring. Enzymes negative. Patient feels somewhat better and will be discharged home and follow-up as needed.     Jasper Riling. Alvino Chapel, MD 01/26/15 [phone]

## 2015-01-26 NOTE — Discharge Instructions (Signed)

## 2015-02-12 DIAGNOSIS — H3321 Serous retinal detachment, right eye: Secondary | ICD-10-CM | POA: Diagnosis not present

## 2015-06-08 DIAGNOSIS — F419 Anxiety disorder, unspecified: Secondary | ICD-10-CM | POA: Diagnosis not present

## 2015-06-08 DIAGNOSIS — E538 Deficiency of other specified B group vitamins: Secondary | ICD-10-CM | POA: Diagnosis not present

## 2015-06-08 DIAGNOSIS — E559 Vitamin D deficiency, unspecified: Secondary | ICD-10-CM | POA: Diagnosis not present

## 2015-06-08 DIAGNOSIS — Z6838 Body mass index (BMI) 38.0-38.9, adult: Secondary | ICD-10-CM | POA: Diagnosis not present

## 2015-06-08 DIAGNOSIS — D649 Anemia, unspecified: Secondary | ICD-10-CM | POA: Diagnosis not present

## 2015-06-08 DIAGNOSIS — M25559 Pain in unspecified hip: Secondary | ICD-10-CM | POA: Diagnosis not present

## 2015-06-30 DIAGNOSIS — H3563 Retinal hemorrhage, bilateral: Secondary | ICD-10-CM | POA: Diagnosis not present

## 2015-07-05 DIAGNOSIS — H3561 Retinal hemorrhage, right eye: Secondary | ICD-10-CM | POA: Diagnosis not present

## 2015-08-09 DIAGNOSIS — H3531 Nonexudative age-related macular degeneration: Secondary | ICD-10-CM | POA: Diagnosis not present

## 2015-09-06 DIAGNOSIS — R195 Other fecal abnormalities: Secondary | ICD-10-CM | POA: Diagnosis not present

## 2015-09-06 DIAGNOSIS — Z6837 Body mass index (BMI) 37.0-37.9, adult: Secondary | ICD-10-CM | POA: Diagnosis not present

## 2015-09-20 DIAGNOSIS — R140 Abdominal distension (gaseous): Secondary | ICD-10-CM | POA: Diagnosis not present

## 2015-09-20 DIAGNOSIS — Z6836 Body mass index (BMI) 36.0-36.9, adult: Secondary | ICD-10-CM | POA: Diagnosis not present

## 2015-09-21 DIAGNOSIS — R140 Abdominal distension (gaseous): Secondary | ICD-10-CM | POA: Diagnosis not present

## 2015-10-15 ENCOUNTER — Telehealth: Payer: Self-pay | Admitting: Allergy and Immunology

## 2015-10-15 NOTE — Telephone Encounter (Signed)
IGNORE... PT MADE APPOINTMENT

## 2015-10-25 DIAGNOSIS — R195 Other fecal abnormalities: Secondary | ICD-10-CM | POA: Diagnosis not present

## 2015-11-01 DIAGNOSIS — R42 Dizziness and giddiness: Secondary | ICD-10-CM | POA: Diagnosis not present

## 2015-11-01 DIAGNOSIS — J069 Acute upper respiratory infection, unspecified: Secondary | ICD-10-CM | POA: Diagnosis not present

## 2015-11-08 ENCOUNTER — Ambulatory Visit (INDEPENDENT_AMBULATORY_CARE_PROVIDER_SITE_OTHER): Payer: Medicare Other | Admitting: Allergy and Immunology

## 2015-11-08 ENCOUNTER — Encounter: Payer: Self-pay | Admitting: Allergy and Immunology

## 2015-11-08 VITALS — BP 108/60 | HR 80 | Resp 18 | Ht <= 58 in | Wt 211.0 lb

## 2015-11-08 DIAGNOSIS — J309 Allergic rhinitis, unspecified: Secondary | ICD-10-CM

## 2015-11-08 DIAGNOSIS — K591 Functional diarrhea: Secondary | ICD-10-CM | POA: Diagnosis not present

## 2015-11-08 DIAGNOSIS — K219 Gastro-esophageal reflux disease without esophagitis: Secondary | ICD-10-CM

## 2015-11-08 DIAGNOSIS — K5900 Constipation, unspecified: Secondary | ICD-10-CM | POA: Diagnosis not present

## 2015-11-08 DIAGNOSIS — H1010 Acute atopic conjunctivitis, unspecified eye: Secondary | ICD-10-CM | POA: Insufficient documentation

## 2015-11-08 DIAGNOSIS — J387 Other diseases of larynx: Secondary | ICD-10-CM | POA: Diagnosis not present

## 2015-11-08 DIAGNOSIS — J4520 Mild intermittent asthma, uncomplicated: Secondary | ICD-10-CM | POA: Diagnosis not present

## 2015-11-08 HISTORY — DX: Acute atopic conjunctivitis, unspecified eye: H10.10

## 2015-11-08 NOTE — Patient Instructions (Signed)
  1. Continue omeprazole 20mg  one tablet two times per day  2. Continue Duoneb every 4-6 hours if needed [facility])  3. Recommend flu vaccine  4. Return in 12 months or earlier if problem

## 2015-11-08 NOTE — Progress Notes (Signed)
[facility]  Follow-up Note  Refering Provider: No ref. provider found Primary Provider: Ailene RavelHAMRICK,MAURA L, MD  Subjective:   Jillian Taylor is a 64 y.o. female who [facility] in re-evaluation of the following:  HPI Comments:  Jillian Taylor returns to this clinic in reevaluation of her asthma and rhinitis and LPR. Overall she has done quite well for the past year. She is not required any systemic steroids for flares of her asthma. She did have one flare of her asthma that was triggered by rhinitis for which she used a short-acting bronchodilator for a short period in time. Otherwise, she rarely uses any short acting bronchodilator. Her exercise is limited by other issues and thus it's difficult to assess for exercise-induced bronchospastic symptoms. She's had very little problems with her nose. Her reflux is under excellent control and she's had no problems with her throat while using omeprazole 20 milk grams twice a day. She refuses the flu vaccine.   Outpatient Encounter Prescriptions as of 11/08/2015  Medication Sig  . albuterol (PROAIR HFA) 108 (90 BASE) MCG/ACT inhaler Inhale 2 puffs into the lungs every 4 (four) hours as needed for wheezing or shortness of breath.  . cetirizine (ZYRTEC) 10 MG tablet Take 10 mg by mouth daily as needed for allergies.  Marland Kitchen. DIAZEPAM PO Take by mouth daily.  Marland Kitchen. ipratropium-albuterol (DUONEB) 0.5-2.5 (3) MG/3ML SOLN Take 3 mLs by nebulization every 4 (four) hours as needed.  . meclizine (ANTIVERT) 25 MG tablet   . omeprazole (PRILOSEC) 20 MG capsule Take 20 mg by mouth 2 (two) times daily.   No facility-administered encounter medications on file as of 11/08/2015.    No orders of the defined types were placed in this encounter.    Past Medical History  Diagnosis Date  . Blind   . Asthma   . Eczema     Past Surgical History  Procedure Laterality Date  .  Cholecystectomy    . Lung biopsy    . Eye surgery    . Tonsillectomy      Allergies  Allergen Reactions  . Penicillins     Review of Systems  Constitutional: Negative.   HENT: Negative.   Eyes: Negative.   Respiratory: Negative.   Cardiovascular: Negative.   Gastrointestinal: Negative.   Musculoskeletal: Negative.      Objective:   Filed Vitals:   11/08/15 1552  BP: 108/60  Pulse: 80  Resp: 18   Height: 2' 1.59" (65 cm)  Weight: 211 lb (95.709 kg)   Physical Exam  Diagnostics:    Spirometry was performed and demonstrated an FEV1 of 2.46 at 117 % of predicted.  The patient had an Asthma Control Test with the following results: ACT Total Score: 21.    Assessment and Plan:   1. Mild intermittent asthma, uncomplicated   2. LPRD (laryngopharyngeal reflux disease)   3. Allergic rhinoconjunctivitis      1. Continue omeprazole 20mg  one tablet two times per day  2. Continue Duoneb every 4-6 hours if needed [facility]  3. Recommend flu vaccine  4. Return in 12 months or earlier if problem  Overall [name] has really done quite well and I see very little need for changing her medical therapy at this point in time. Ever since we started treating her for her reflux she has had an excellent response regarding almost all of her respiratory tract symptoms. She has been able to  come off her inhaled steroid and her requirement for short acting bronchodilator is basically 0. I will see her back in this clinic in 12 months or earlier if there is a problem.   Laurette Schimke, MD [facility]

## 2015-11-12 DIAGNOSIS — H20823 Vogt-Koyanagi syndrome, bilateral: Secondary | ICD-10-CM | POA: Diagnosis not present

## 2015-11-12 DIAGNOSIS — H209 Unspecified iridocyclitis: Secondary | ICD-10-CM | POA: Diagnosis not present

## 2015-11-17 ENCOUNTER — Other Ambulatory Visit: Payer: Self-pay | Admitting: *Deleted

## 2015-11-17 MED ORDER — IPRATROPIUM-ALBUTEROL 0.5-2.5 (3) MG/3ML IN SOLN: 3.0000 mL | RESPIRATORY_TRACT | Status: DC | PRN

## 2015-12-13 DIAGNOSIS — K591 Functional diarrhea: Secondary | ICD-10-CM | POA: Diagnosis not present

## 2015-12-13 DIAGNOSIS — K644 Residual hemorrhoidal skin tags: Secondary | ICD-10-CM | POA: Diagnosis not present

## 2016-03-27 DIAGNOSIS — E559 Vitamin D deficiency, unspecified: Secondary | ICD-10-CM | POA: Diagnosis not present

## 2016-03-27 DIAGNOSIS — Z0000 Encounter for general adult medical examination without abnormal findings: Secondary | ICD-10-CM | POA: Diagnosis not present

## 2016-03-27 DIAGNOSIS — E669 Obesity, unspecified: Secondary | ICD-10-CM | POA: Diagnosis not present

## 2016-03-27 DIAGNOSIS — Z1231 Encounter for screening mammogram for malignant neoplasm of breast: Secondary | ICD-10-CM | POA: Diagnosis not present

## 2016-03-27 DIAGNOSIS — Z1389 Encounter for screening for other disorder: Secondary | ICD-10-CM | POA: Diagnosis not present

## 2016-03-27 DIAGNOSIS — Z1159 Encounter for screening for other viral diseases: Secondary | ICD-10-CM | POA: Diagnosis not present

## 2016-03-27 DIAGNOSIS — Z131 Encounter for screening for diabetes mellitus: Secondary | ICD-10-CM | POA: Diagnosis not present

## 2016-03-27 DIAGNOSIS — E2839 Other primary ovarian failure: Secondary | ICD-10-CM | POA: Diagnosis not present

## 2016-04-11 DIAGNOSIS — Z131 Encounter for screening for diabetes mellitus: Secondary | ICD-10-CM | POA: Diagnosis not present

## 2016-04-11 DIAGNOSIS — Z1159 Encounter for screening for other viral diseases: Secondary | ICD-10-CM | POA: Diagnosis not present

## 2016-04-11 DIAGNOSIS — E559 Vitamin D deficiency, unspecified: Secondary | ICD-10-CM | POA: Diagnosis not present

## 2016-04-12 DIAGNOSIS — E2839 Other primary ovarian failure: Secondary | ICD-10-CM | POA: Diagnosis not present

## 2016-04-12 DIAGNOSIS — Z1231 Encounter for screening mammogram for malignant neoplasm of breast: Secondary | ICD-10-CM | POA: Diagnosis not present

## 2016-04-12 DIAGNOSIS — M8588 Other specified disorders of bone density and structure, other site: Secondary | ICD-10-CM | POA: Diagnosis not present

## 2016-07-12 DIAGNOSIS — R198 Other specified symptoms and signs involving the digestive system and abdomen: Secondary | ICD-10-CM | POA: Diagnosis not present

## 2016-07-12 DIAGNOSIS — R300 Dysuria: Secondary | ICD-10-CM | POA: Diagnosis not present

## 2016-07-12 DIAGNOSIS — Z6836 Body mass index (BMI) 36.0-36.9, adult: Secondary | ICD-10-CM | POA: Diagnosis not present

## 2016-07-14 DIAGNOSIS — H20823 Vogt-Koyanagi syndrome, bilateral: Secondary | ICD-10-CM | POA: Diagnosis not present

## 2016-08-18 DIAGNOSIS — H20823 Vogt-Koyanagi syndrome, bilateral: Secondary | ICD-10-CM | POA: Diagnosis not present

## 2016-09-01 DIAGNOSIS — H20823 Vogt-Koyanagi syndrome, bilateral: Secondary | ICD-10-CM | POA: Diagnosis not present

## 2016-09-16 ENCOUNTER — Emergency Department (HOSPITAL_COMMUNITY): Payer: Medicare Other

## 2016-09-16 ENCOUNTER — Emergency Department (HOSPITAL_COMMUNITY)
Admission: EM | Admit: 2016-09-16 | Discharge: 2016-09-16 | Disposition: A | Discharge status: Home / Self Care | Payer: Medicare Other | Attending: Emergency Medicine | Admitting: Emergency Medicine

## 2016-09-16 ENCOUNTER — Encounter (HOSPITAL_COMMUNITY): Payer: Self-pay | Admitting: Emergency Medicine

## 2016-09-16 DIAGNOSIS — Z79899 Other long term (current) drug therapy: Secondary | ICD-10-CM | POA: Insufficient documentation

## 2016-09-16 DIAGNOSIS — R002 Palpitations: Secondary | ICD-10-CM | POA: Diagnosis not present

## 2016-09-16 DIAGNOSIS — M5489 Other dorsalgia: Secondary | ICD-10-CM | POA: Diagnosis not present

## 2016-09-16 DIAGNOSIS — I491 Atrial premature depolarization: Secondary | ICD-10-CM | POA: Diagnosis not present

## 2016-09-16 DIAGNOSIS — I499 Cardiac arrhythmia, unspecified: Secondary | ICD-10-CM | POA: Diagnosis not present

## 2016-09-16 DIAGNOSIS — J45909 Unspecified asthma, uncomplicated: Secondary | ICD-10-CM | POA: Insufficient documentation

## 2016-09-16 DIAGNOSIS — I4891 Unspecified atrial fibrillation: Secondary | ICD-10-CM | POA: Diagnosis not present

## 2016-09-16 HISTORY — DX: Unspecified osteoarthritis, unspecified site: M19.90

## 2016-09-16 LAB — CBC
HCT: 38.4 % (ref 36.0–46.0)
Hemoglobin: 12.1 g/dL (ref 12.0–15.0)
MCH: 22.1 pg — ABNORMAL LOW (ref 26.0–34.0)
MCHC: 31.5 g/dL (ref 30.0–36.0)
MCV: 70.1 fL — ABNORMAL LOW (ref 78.0–100.0)
Platelets: 168 10*3/uL (ref 150–400)
RBC: 5.48 MIL/uL — ABNORMAL HIGH (ref 3.87–5.11)
RDW: 14.1 % (ref 11.5–15.5)
WBC: 7.1 10*3/uL (ref 4.0–10.5)

## 2016-09-16 LAB — BASIC METABOLIC PANEL
Anion gap: 8 (ref 5–15)
BUN: 11 mg/dL (ref 6–20)
CO2: 27 mmol/L (ref 22–32)
Calcium: 9.6 mg/dL (ref 8.9–10.3)
Chloride: 105 mmol/L (ref 101–111)
Creatinine, Ser: 0.72 mg/dL (ref 0.44–1.00)
GFR calc Af Amer: >60 mL/min (ref >60)
GFR calc non Af Amer: >60 mL/min (ref >60)
Glucose, Bld: 91 mg/dL (ref 65–99)
Potassium: 4 mmol/L (ref 3.5–5.1)
Sodium: 140 mmol/L (ref 135–145)

## 2016-09-16 LAB — TROPONIN I: Troponin I: <0.03 ng/mL (ref <0.03)

## 2016-09-16 NOTE — ED Notes (Signed)
Pt states fluttery feeling in chest has dissapated

## 2016-09-16 NOTE — ED Provider Notes (Signed)
[facility]-EMERGENCY DEPT Provider Note   CSN: [id number] Arrival date & time: 09/16/16  1842     History   Chief Complaint Chief Complaint  Patient presents with  . Back Pain  . Irregular Heart Beat    HPI Jillian Taylor is a 65 y.o. female.  The history is provided by the patient.  Patient presented after feeling palpitations. States she was feeling a fluttering in the middle of her chest. Began earlier today. No chest pain with it. No chest pain or shortness of breath. No abdominal pain. No lightheadedness or dizziness. She has some slight lower back pain but states she has chronic back pain due to previous steroid use and arthritis. She is blind.  Past Medical History:  Diagnosis Date  . Arthritis   . Asthma   . Blind   . Eczema     Patient Active Problem List   Diagnosis Date Noted  . Allergic rhinoconjunctivitis 11/08/2015  . ANXIETY DEPRESSION 07/02/2007  . UVEITIS 07/02/2007  . Mild intermittent asthma 07/02/2007  . PULMONARY NODULE 07/02/2007  . CONSTIPATION, RECURRENT 07/02/2007  . MENOPAUSAL SYNDROME 07/02/2007  . ALOPECIA AREATA 07/02/2007  . BACK PAIN, LUMBAR, CHRONIC 07/02/2007  . LPRD (laryngopharyngeal reflux disease) 07/02/2007  . TOBACCO USE, QUIT 07/02/2007    Past Surgical History:  Procedure Laterality Date  . CHOLECYSTECTOMY    . EYE SURGERY    . LUNG BIOPSY    . TONSILLECTOMY      OB History    No data available       Home Medications    Prior to Admission medications   Medication Sig Start Date End Date Taking? Authorizing Provider  albuterol (PROAIR HFA) 108 (90 BASE) MCG/ACT inhaler Inhale 2 puffs into the lungs every 4 (four) hours as needed for wheezing or shortness of breath.   Yes Historical Provider, MD  cetirizine (ZYRTEC) 10 MG tablet Take 10 mg by mouth daily as needed for allergies.   Yes Historical Provider, MD  Cholecalciferol (VITAMIN D PO) Take 1 capsule by mouth daily.   Yes Historical Provider, MD  Cyanocobalamin  (B-12 PO) Take 1 tablet by mouth daily.   Yes Historical Provider, MD  diazepam (VALIUM) 2 MG tablet Take 2 mg by mouth 2 (two) times daily. 08/29/16  Yes Historical Provider, MD  HYDROcodone-acetaminophen (NORCO/VICODIN) 5-325 MG tablet Take 1 tablet by mouth 2 (two) times daily as needed for moderate pain.  08/01/16  Yes Historical Provider, MD  ipratropium-albuterol (DUONEB) 0.5-2.5 (3) MG/3ML SOLN Take 3 mLs by nebulization every 4 (four) hours as needed. 11/17/15  Yes Jessica Priest, MD  meclizine (ANTIVERT) 25 MG tablet Take 25 mg by mouth daily as needed for dizziness.  11/01/15  Yes Historical Provider, MD  omeprazole (PRILOSEC) 20 MG capsule Take 20 mg by mouth 2 (two) times daily.   Yes Historical Provider, MD  PRED FORTE 1 % ophthalmic suspension 1 drop in the left eye 4 times daily and 1 drop in the right eye 3 times  07/17/16  Yes Historical Provider, MD    Family History Family History  Problem Relation Age of Onset  . Asthma Sister   . Allergic rhinitis Sister   . Asthma Brother   . Allergic Disorder Brother   . Asthma Sister     Social History Social History  Substance Use Topics  . Smoking status: Never Smoker  . Smokeless tobacco: Never Used  . Alcohol use 1.2 oz/week    1 Glasses of  wine, 1 Cans of beer per week     Allergies   Methotrexate derivatives and Penicillins   Review of Systems Review of Systems  Constitutional: Negative for appetite change.  HENT: Negative for congestion.   Respiratory: Negative for cough.   Cardiovascular: Positive for palpitations. Negative for chest pain.  Gastrointestinal: Negative for abdominal pain.  Genitourinary: Negative for flank pain.  Musculoskeletal: Positive for back pain.  Neurological: Negative for seizures.  Hematological: Negative for adenopathy.  Psychiatric/Behavioral: Negative for confusion.     Physical Exam Updated Vital Signs BP 155/79 (BP Location: Left Arm)   Pulse 70   Temp 98 F (36.7 C) (Oral)    SpO2 100%   Physical Exam  Constitutional: She appears well-nourished.  HENT:  Head: Atraumatic.  Eyes:  Patient is blind and has nystagmus.  Cardiovascular: Normal rate and regular rhythm.   Pulmonary/Chest: Effort normal.  Abdominal: Soft. There is no tenderness.  Neurological: She is alert.  Skin: Skin is warm. Capillary refill takes less than 2 seconds.  Psychiatric: She has a normal mood and affect.     ED Treatments / Results  Labs (all labs ordered are listed, but only abnormal results are displayed) Labs Reviewed  CBC - Abnormal; Notable for the following:       Result Value   RBC 5.48 (*)    MCV 70.1 (*)    MCH 22.1 (*)    All other components within normal limits  BASIC METABOLIC PANEL  TROPONIN I    EKG  EKG Interpretation  Date/Time:  Saturday September 16 2016 19:02:15 EDT Ventricular Rate:  70 PR Interval:    QRS Duration: 91 QT Interval:  401 QTC Calculation: 433 R Axis:   56 Text Interpretation:  Sinus rhythm Confirmed by Rubin PayorPICKERING  MD, Harrold DonathNATHAN [phone]([id number]) on 09/16/2016 7:30:50 PM       Radiology Dg Chest 2 View  Result Date: 09/16/2016 CLINICAL DATA:  Palpitations, asthma, history of lung nodules EXAM: CHEST  2 VIEW COMPARISON:  01/26/2015 FINDINGS: Multiple bilateral pulmonary nodules/masses, measuring up to 3.5 cm in the left mid lung, grossly unchanged since 2016. No pleural effusion or pneumothorax. The heart is normal in size. Mild degenerative changes of the visualized thoracolumbar spine. IMPRESSION: Multiple bilateral pulmonary nodules/ masses, chronic. No evidence of acute cardiopulmonary disease. Electronically Signed   By: Charline BillsSriyesh  Krishnan M.D.   On: 09/16/2016 20:02    Procedures Procedures (including critical care time)  Medications Ordered in ED Medications - No data to display   Initial Impression / Assessment and Plan / ED Course  I have reviewed the triage vital signs and the nursing notes.  Pertinent labs & imaging results  that were available during my care of the patient were reviewed by me and considered in my medical decision making (see chart for details).  Clinical Course    Patient sent in for palpitations. Symptoms have resolved and his sinus rhythm here. Reviewed EKGs from EMS of her supposedly A. fib that appears to be just PACs with a compensatory pause. Lab work reassuring. Think this is the fluttering that she was feeling. This does not appear to be atrial fibrillation released off the EKGs that EMS gave to us. Will discharge home.   Final Clinic al Impressions(s) / ED Diagnoses   Final diagnoses:  Palpitations  Premature atrial contractions    New Prescriptions New Prescriptions   No medications on file     Benjiman CoreNathan Jaydeen Darley, MD 09/16/16 2112

## 2016-09-16 NOTE — ED Notes (Signed)
Bed: [id number] Expected date:  Expected time:  Means of arrival:  Comments: [age] F palpitations and back pain
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
## 2016-09-19 DIAGNOSIS — I491 Atrial premature depolarization: Secondary | ICD-10-CM | POA: Diagnosis not present

## 2016-09-19 DIAGNOSIS — Z6837 Body mass index (BMI) 37.0-37.9, adult: Secondary | ICD-10-CM | POA: Diagnosis not present

## 2016-09-19 DIAGNOSIS — R300 Dysuria: Secondary | ICD-10-CM | POA: Diagnosis not present

## 2016-09-19 DIAGNOSIS — Z79899 Other long term (current) drug therapy: Secondary | ICD-10-CM | POA: Diagnosis not present

## 2016-09-19 DIAGNOSIS — E669 Obesity, unspecified: Secondary | ICD-10-CM | POA: Diagnosis not present

## 2016-11-09 DIAGNOSIS — M25512 Pain in left shoulder: Secondary | ICD-10-CM | POA: Diagnosis not present

## 2016-11-09 DIAGNOSIS — I499 Cardiac arrhythmia, unspecified: Secondary | ICD-10-CM | POA: Diagnosis not present

## 2016-11-09 DIAGNOSIS — Z6838 Body mass index (BMI) 38.0-38.9, adult: Secondary | ICD-10-CM | POA: Diagnosis not present

## 2016-12-13 DIAGNOSIS — I491 Atrial premature depolarization: Secondary | ICD-10-CM | POA: Diagnosis not present

## 2016-12-13 DIAGNOSIS — R42 Dizziness and giddiness: Secondary | ICD-10-CM | POA: Diagnosis not present

## 2016-12-13 DIAGNOSIS — I499 Cardiac arrhythmia, unspecified: Secondary | ICD-10-CM | POA: Diagnosis not present

## 2016-12-13 DIAGNOSIS — Z0000 Encounter for general adult medical examination without abnormal findings: Secondary | ICD-10-CM | POA: Diagnosis not present

## 2016-12-13 DIAGNOSIS — Z79899 Other long term (current) drug therapy: Secondary | ICD-10-CM | POA: Diagnosis not present

## 2016-12-15 DIAGNOSIS — I499 Cardiac arrhythmia, unspecified: Secondary | ICD-10-CM | POA: Diagnosis not present

## 2016-12-15 DIAGNOSIS — R718 Other abnormality of red blood cells: Secondary | ICD-10-CM | POA: Diagnosis not present

## 2017-01-15 DIAGNOSIS — M79641 Pain in right hand: Secondary | ICD-10-CM | POA: Diagnosis not present

## 2017-01-19 DIAGNOSIS — M7989 Other specified soft tissue disorders: Secondary | ICD-10-CM | POA: Diagnosis not present

## 2017-01-19 DIAGNOSIS — Z043 Encounter for examination and observation following other accident: Secondary | ICD-10-CM | POA: Diagnosis not present

## 2017-01-25 DIAGNOSIS — S62646A Nondisplaced fracture of proximal phalanx of right little finger, initial encounter for closed fracture: Secondary | ICD-10-CM | POA: Diagnosis not present

## 2017-04-09 DIAGNOSIS — K219 Gastro-esophageal reflux disease without esophagitis: Secondary | ICD-10-CM | POA: Diagnosis not present

## 2017-04-09 DIAGNOSIS — F419 Anxiety disorder, unspecified: Secondary | ICD-10-CM | POA: Diagnosis not present

## 2017-04-09 DIAGNOSIS — K529 Noninfective gastroenteritis and colitis, unspecified: Secondary | ICD-10-CM | POA: Diagnosis not present

## 2017-04-09 DIAGNOSIS — R197 Diarrhea, unspecified: Secondary | ICD-10-CM | POA: Diagnosis not present

## 2017-04-09 DIAGNOSIS — Z1231 Encounter for screening mammogram for malignant neoplasm of breast: Secondary | ICD-10-CM | POA: Diagnosis not present

## 2017-04-09 DIAGNOSIS — Z0000 Encounter for general adult medical examination without abnormal findings: Secondary | ICD-10-CM | POA: Diagnosis not present

## 2017-04-09 DIAGNOSIS — E669 Obesity, unspecified: Secondary | ICD-10-CM | POA: Diagnosis not present

## 2017-04-09 DIAGNOSIS — R52 Pain, unspecified: Secondary | ICD-10-CM | POA: Diagnosis not present

## 2017-04-09 DIAGNOSIS — Z6833 Body mass index (BMI) 33.0-33.9, adult: Secondary | ICD-10-CM | POA: Diagnosis not present

## 2017-04-26 DIAGNOSIS — Z1231 Encounter for screening mammogram for malignant neoplasm of breast: Secondary | ICD-10-CM | POA: Diagnosis not present

## 2017-05-01 DIAGNOSIS — J019 Acute sinusitis, unspecified: Secondary | ICD-10-CM | POA: Diagnosis not present

## 2017-05-01 DIAGNOSIS — Z6838 Body mass index (BMI) 38.0-38.9, adult: Secondary | ICD-10-CM | POA: Diagnosis not present

## 2017-06-14 DIAGNOSIS — Z6838 Body mass index (BMI) 38.0-38.9, adult: Secondary | ICD-10-CM | POA: Diagnosis not present

## 2017-06-14 DIAGNOSIS — S91209A Unspecified open wound of unspecified toe(s) with damage to nail, initial encounter: Secondary | ICD-10-CM | POA: Diagnosis not present

## 2017-07-10 DIAGNOSIS — H20823 Vogt-Koyanagi syndrome, bilateral: Secondary | ICD-10-CM | POA: Diagnosis not present

## 2017-08-13 DIAGNOSIS — H20823 Vogt-Koyanagi syndrome, bilateral: Secondary | ICD-10-CM | POA: Diagnosis not present

## 2017-08-17 DIAGNOSIS — Z6838 Body mass index (BMI) 38.0-38.9, adult: Secondary | ICD-10-CM | POA: Diagnosis not present

## 2017-08-17 DIAGNOSIS — R42 Dizziness and giddiness: Secondary | ICD-10-CM | POA: Diagnosis not present

## 2017-08-30 DIAGNOSIS — Z6838 Body mass index (BMI) 38.0-38.9, adult: Secondary | ICD-10-CM | POA: Diagnosis not present

## 2017-08-30 DIAGNOSIS — E669 Obesity, unspecified: Secondary | ICD-10-CM | POA: Diagnosis not present

## 2017-08-30 DIAGNOSIS — R1084 Generalized abdominal pain: Secondary | ICD-10-CM | POA: Diagnosis not present

## 2017-09-04 DIAGNOSIS — R109 Unspecified abdominal pain: Secondary | ICD-10-CM | POA: Diagnosis not present

## 2017-09-04 DIAGNOSIS — R1084 Generalized abdominal pain: Secondary | ICD-10-CM | POA: Diagnosis not present

## 2017-11-02 DIAGNOSIS — M5442 Lumbago with sciatica, left side: Secondary | ICD-10-CM | POA: Diagnosis not present

## 2018-01-03 DIAGNOSIS — Z6838 Body mass index (BMI) 38.0-38.9, adult: Secondary | ICD-10-CM | POA: Diagnosis not present

## 2018-01-03 DIAGNOSIS — J45901 Unspecified asthma with (acute) exacerbation: Secondary | ICD-10-CM | POA: Diagnosis not present

## 2018-02-26 DIAGNOSIS — K1120 Sialoadenitis, unspecified: Secondary | ICD-10-CM | POA: Diagnosis not present

## 2018-02-26 DIAGNOSIS — F419 Anxiety disorder, unspecified: Secondary | ICD-10-CM | POA: Diagnosis not present

## 2018-02-26 DIAGNOSIS — Z6838 Body mass index (BMI) 38.0-38.9, adult: Secondary | ICD-10-CM | POA: Diagnosis not present

## 2018-04-18 DIAGNOSIS — Z0000 Encounter for general adult medical examination without abnormal findings: Secondary | ICD-10-CM | POA: Diagnosis not present

## 2018-04-18 DIAGNOSIS — Z9181 History of falling: Secondary | ICD-10-CM | POA: Diagnosis not present

## 2018-04-18 DIAGNOSIS — E669 Obesity, unspecified: Secondary | ICD-10-CM | POA: Diagnosis not present

## 2018-04-18 DIAGNOSIS — Z136 Encounter for screening for cardiovascular disorders: Secondary | ICD-10-CM | POA: Diagnosis not present

## 2018-04-18 DIAGNOSIS — Z6841 Body Mass Index (BMI) 40.0 and over, adult: Secondary | ICD-10-CM | POA: Diagnosis not present

## 2018-04-18 DIAGNOSIS — N959 Unspecified menopausal and perimenopausal disorder: Secondary | ICD-10-CM | POA: Diagnosis not present

## 2018-04-18 DIAGNOSIS — Z1231 Encounter for screening mammogram for malignant neoplasm of breast: Secondary | ICD-10-CM | POA: Diagnosis not present

## 2018-04-18 DIAGNOSIS — E785 Hyperlipidemia, unspecified: Secondary | ICD-10-CM | POA: Diagnosis not present

## 2018-05-02 DIAGNOSIS — Z6841 Body Mass Index (BMI) 40.0 and over, adult: Secondary | ICD-10-CM | POA: Diagnosis not present

## 2018-05-02 DIAGNOSIS — Z124 Encounter for screening for malignant neoplasm of cervix: Secondary | ICD-10-CM | POA: Diagnosis not present

## 2018-05-02 DIAGNOSIS — Z01419 Encounter for gynecological examination (general) (routine) without abnormal findings: Secondary | ICD-10-CM | POA: Diagnosis not present

## 2018-05-16 DIAGNOSIS — Z13820 Encounter for screening for osteoporosis: Secondary | ICD-10-CM | POA: Diagnosis not present

## 2018-05-16 DIAGNOSIS — E2839 Other primary ovarian failure: Secondary | ICD-10-CM | POA: Diagnosis not present

## 2018-05-16 DIAGNOSIS — Z1231 Encounter for screening mammogram for malignant neoplasm of breast: Secondary | ICD-10-CM | POA: Diagnosis not present

## 2018-07-25 DIAGNOSIS — Z6841 Body Mass Index (BMI) 40.0 and over, adult: Secondary | ICD-10-CM | POA: Diagnosis not present

## 2018-07-25 DIAGNOSIS — S99929A Unspecified injury of unspecified foot, initial encounter: Secondary | ICD-10-CM | POA: Diagnosis not present

## 2018-07-25 DIAGNOSIS — H04129 Dry eye syndrome of unspecified lacrimal gland: Secondary | ICD-10-CM | POA: Diagnosis not present

## 2019-03-24 ENCOUNTER — Other Ambulatory Visit: Payer: Self-pay

## 2019-03-24 ENCOUNTER — Ambulatory Visit (INDEPENDENT_AMBULATORY_CARE_PROVIDER_SITE_OTHER): Payer: Medicare Other | Admitting: Cardiology

## 2019-03-24 ENCOUNTER — Telehealth: Payer: Self-pay

## 2019-03-24 ENCOUNTER — Encounter: Payer: Self-pay | Admitting: Cardiology

## 2019-03-24 VITALS — BP 156/99 | Ht 65.0 in | Wt 240.0 lb

## 2019-03-24 DIAGNOSIS — I10 Essential (primary) hypertension: Secondary | ICD-10-CM

## 2019-03-24 DIAGNOSIS — E782 Mixed hyperlipidemia: Secondary | ICD-10-CM

## 2019-03-24 DIAGNOSIS — R0789 Other chest pain: Secondary | ICD-10-CM | POA: Insufficient documentation

## 2019-03-24 DIAGNOSIS — R079 Chest pain, unspecified: Secondary | ICD-10-CM

## 2019-03-24 DIAGNOSIS — E663 Overweight: Secondary | ICD-10-CM

## 2019-03-24 HISTORY — DX: Mixed hyperlipidemia: E78.2

## 2019-03-24 HISTORY — DX: Other chest pain: R07.89

## 2019-03-24 HISTORY — DX: Essential (primary) hypertension: I10

## 2019-03-24 HISTORY — DX: Overweight: E66.3

## 2019-03-24 MED ORDER — AMLODIPINE BESYLATE 2.5 MG PO TABS: 2.5000 mg | ORAL_TABLET | Freq: Every day | ORAL | 3 refills | Status: DC

## 2019-03-24 MED ORDER — NITROGLYCERIN 0.4 MG SL SUBL: 0.4000 mg | SUBLINGUAL_TABLET | SUBLINGUAL | 3 refills | Status: DC | PRN

## 2019-03-24 NOTE — Telephone Encounter (Signed)
Patient visually impaired but feels comfortable that she can operate dioxymi due to new patient telemedicine protocol.

## 2019-03-24 NOTE — Progress Notes (Signed)
Virtual Visit via Video Note    Evaluation Performed:  Follow-up visit  This visit type was conducted due to national recommendations for restrictions regarding the COVID-19 Pandemic (e.g. social distancing).  This format is felt to be most appropriate for this patient at this time.  All issues noted in this document were discussed and addressed.  No physical exam was performed (except for noted visual exam findings with Video Visits).  Please refer to the patient's chart (MyChart message for video visits and phone note for telephone visits) for the patient's consent to telehealth for [facility].  Date:  03/24/2019   ID:  Jillian Taylor, DOB 1951/12/13, MRN [id number]  Patient Location:  [place]    Provider location:   [place]   PCP:  Ailene Ravel, MD  Cardiologist:  No primary care provider on file.  Electrophysiologist:  None   Chief Complaint:  Chest discomfort  History of Present Illness:    Jillian Taylor is a 68 y.o. female who presents via audio/video conferencing for a telehealth visit today.  The patient was evaluated by me for chest pain.  She mentions to me that she has chest discomfort at times.  This has no radiation.  It is stabbing-like sensation.  No orthopnea or PND.  No radiation to the neck or to the arms.  She tells me that she ambulates with the best of her ability.  She has visual deficit and is here helping her for a part of the day.  For the symptoms she was referred.  She denies any history of diabetes mellitus.  She tells me that she is overweight.  At the time of my evaluation, the patient is alert awake oriented and in no distress.  The patient does not symptoms concerning for COVID-19 infection (fever, chills, cough, or new shortness of breath).    Prior CV studies:   The following studies were reviewed today:  ROS was all negative except above  Past Medical History:  Diagnosis Date  . Arthritis   . Asthma   . Blind   . Eczema     Past Surgical History:  Procedure Laterality Date  . CHOLECYSTECTOMY    . EYE SURGERY    . LUNG BIOPSY    . TONSILLECTOMY       Current Meds  Medication Sig  . albuterol (PROAIR HFA) 108 (90 BASE) MCG/ACT inhaler Inhale 2 puffs into the lungs every 4 (four) hours as needed for wheezing or shortness of breath.  . cetirizine (ZYRTEC) 10 MG tablet Take 10 mg by mouth daily as needed for allergies.  . Cholecalciferol (VITAMIN D PO) Take 1 capsule by mouth daily.  . Cyanocobalamin (B-12 PO) Take 1 tablet by mouth daily.  . diazepam (VALIUM) 2 MG tablet Take 2 mg by mouth 2 (two) times daily.  Marland Kitchen HYDROcodone-acetaminophen (NORCO/VICODIN) 5-325 MG tablet Take 1 tablet by mouth 2 (two) times daily as needed for moderate pain.   Marland Kitchen ipratropium-albuterol (DUONEB) 0.5-2.5 (3) MG/3ML SOLN Take 3 mLs by nebulization every 4 (four) hours as needed.  . pantoprazole (PROTONIX) 40 MG tablet Take 40 mg by mouth daily.  Marland Kitchen PRED FORTE 1 % ophthalmic suspension 1 drop in the left eye 4 times daily and 1 drop in the right eye 3 times      Allergies:   Methotrexate derivatives and Penicillins   Social History   Tobacco Use  . Smoking status: Never Smoker  . Smokeless tobacco: Never Used  Substance  Use Topics  . Alcohol use: Yes    Alcohol/week: 2.0 standard drinks    Types: 1 Glasses of wine, 1 Cans of beer per week  . Drug use: No     Family Hx: The patient's family history includes Allergic Disorder in her brother; Allergic rhinitis in her sister; Asthma in her brother, sister, and sister.  ROS:   Please see the history of present illness.    ROS was negative except above  All other systems reviewed and are negative.   Labs/Other Tests and Data Reviewed:    Recent Labs: No results found for requested labs within last 8760 hours.   Recent Lipid Panel Lab Results  Component Value Date/Time   CHOL 197 06/09/2008 08:14 PM   TRIG 101 06/09/2008 08:14 PM   HDL 73 06/09/2008 08:14 PM    CHOLHDL 2.7 Ratio 06/09/2008 08:14 PM   LDLCALC 104 (H) 06/09/2008 08:14 PM    Wt Readings from Last 3 Encounters:  03/24/19 240 lb (108.9 kg)  11/08/15 211 lb (95.7 kg)     Exam:    Vital Signs:  BP (!) 156/99 (BP Location: Left Arm, Patient Position: Sitting, Cuff Size: Normal)   Ht 5\' 5"  (1.651 m)   Wt 240 lb (108.9 kg)   BMI 39.94 kg/m    Well nourished, well developed female in no acute distress. Alert awake and oriented. Respiration was fine.  ASSESSMENT & PLAN:    1.  Chest discomfort: This is atypical in nature.  The patient has multiple risk factors for coronary artery disease.  In view of the fact that her blood pressure is elevated I will start her on amlodipine 2.5 mg daily.  This will also have some anginal effect and also take care of her blood pressure issues to some extent.  She keep a track of her blood pressures and let was know.  Diet was discussed with the patient and low-salt diet was recommended and she vocalized understanding.  She tells me that she has been a little lax with the salt and she will be careful.  I reviewed her blood work extensively and her chart from primary care physician and EKG.  EKG reveals sinus rhythm and nonspecific ST-T changes. 2.  Essential hypertension: As discussed above 3.  Overweight: Diet was discussed for obesity and risks of obesity explained and she vocalized understanding and she plans to do better with her weight. 4.  Sublingual nitroglycerin prescription was sent, its protocol and 911 protocol explained and the patient vocalized understanding questions were answered to the patient's satisfaction 5.  Echocardiogram will be done to assess murmur heard on auscultation and she will go for Lexiscan sestamibi testing when recovered virus restrictions are lifted.  She knows to go to the nearest emergency room for any concerning symptoms. 6.Patient will be seen in follow-up appointment in 6 months or earlier if the patient has any  concerns   COVID-19 Education: The signs and symptoms of COVID-19 were discussed with the patient and how to seek care for testing (follow up with PCP or arrange E-visit).  The importance of social distancing was discussed today.  Patient Risk:   After full review of this patients clinical status, I feel that they are at least moderate risk at this time.  Time:   Today, I have spent 25 minutes with the patient with telehealth technology discussing the above-mentioned issues..     Medication Adjustments/Labs and Tests Ordered: Current medicines are reviewed at length with the  patient today.  Concerns regarding medicines are outlined above.  Tests Ordered: No orders of the defined types were placed in this encounter.  Medication Changes: No orders of the defined types were placed in this encounter.   Disposition:  Follow up in 3 month(s)  Signed, Garwin Brothers, MD  03/24/2019 12:26 PM    [facility]

## 2019-03-24 NOTE — Patient Instructions (Signed)
Medication Instructions:  START taking nitroglycerin as needed for chest pain When having chest pain, stop what you are doing and sit down. Take 1 nitro, wait 5 minutes. Still having chest pain, take 1 nitro, wait 5 minutes. Still having chest pain, take 1 nitro, dial 911. Total of 3 nitro in 15 minutes.  START taking amlodipine 2.5 mg (1 tablet) once daily  If you need a refill on your cardiac medications before your next appointment, please call your pharmacy.   Lab work: NONE If you have labs (blood work) drawn today and your tests are completely normal, you will receive your results only by: Marland [facility] MyChart Message (if you have MyChart) OR . A paper copy in the mail If you have any lab test that is abnormal or we need to change your treatment, we will call you to review the results.  Testing/Procedures: Your physician has requested that you have an echocardiogram. Echocardiography is a painless test that uses sound waves to create images of your heart. It provides your doctor with information about the size and shape of your heart and how well your heart's chambers and valves are working. This procedure takes approximately one hour. There are no restrictions for this procedure. Your physician has requested that you have a lexiscan myoview. For further information please visit [email or web address] Please follow instruction sheet, as given.   Follow-Up: At [facility], you and your health needs are our priority.  As part of our continuing mission to provide you with exceptional heart care, we have created designated Provider Care Teams.  These Care Teams include your primary Cardiologist (physician) and Advanced Practice Providers (APPs -  Physician Assistants and Nurse Practitioners) who all work together to provide you with the care you need, when you need it. You will need a follow up appointment in 4 months.    Any Other Special Instructions Will Be Listed Below   Echocardiogram An  echocardiogram is a procedure that uses painless sound waves (ultrasound) to produce an image of the heart. Images from an echocardiogram can provide important information about:  Signs of coronary artery disease (CAD).  Aneurysm detection. An aneurysm is a weak or damaged part of an artery wall that bulges out from the normal force of blood pumping through the body.  Heart size and shape. Changes in the size or shape of the heart can be associated with certain conditions, including heart failure, aneurysm, and CAD.  Heart muscle function.  Heart valve function.  Signs of a past heart attack.  Fluid buildup around the heart.  Thickening of the heart muscle.  A tumor or infectious growth around the heart valves. Tell a health care provider about:  Any allergies you have.  All medicines you are taking, including vitamins, herbs, eye drops, creams, and over-the-counter medicines.  Any blood disorders you have.  Any surgeries you have had.  Any medical conditions you have.  Whether you are pregnant or may be pregnant. What are the risks? Generally, this is a safe procedure. However, problems may occur, including:  Allergic reaction to dye (contrast) that may be used during the procedure. What happens before the procedure? No specific preparation is needed. You may eat and drink normally. What happens during the procedure?   An IV tube may be inserted into one of your veins.  You may receive contrast through this tube. A contrast is an injection that improves the quality of the pictures from your heart.  A gel will be applied to  your chest.  A wand-like tool (transducer) will be moved over your chest. The gel will help to transmit the sound waves from the transducer.  The sound waves will harmlessly bounce off of your heart to allow the heart images to be captured in real-time motion. The images will be recorded on a computer. The procedure may vary among health care  providers and hospitals. What happens after the procedure?  You may return to your normal, everyday life, including diet, activities, and medicines, unless your health care provider tells you not to do that. Summary  An echocardiogram is a procedure that uses painless sound waves (ultrasound) to produce an image of the heart.  Images from an echocardiogram can provide important information about the size and shape of your heart, heart muscle function, heart valve function, and fluid buildup around your heart.  You do not need to do anything to prepare before this procedure. You may eat and drink normally.  After the echocardiogram is completed, you may return to your normal, everyday life, unless your health care provider tells you not to do that. This information is not intended to replace advice given to you by your health care provider. Make sure you discuss any questions you have with your health care provider. Document Released: 12/08/2000 Document Revised: 01/13/2017 Document Reviewed: 01/13/2017  Regadenoson injection What is this medicine? REGADENOSON is used to test the heart for coronary artery disease. It is used in patients who can not exercise for their stress test. This medicine may be used for other purposes; ask your health care provider or pharmacist if you have questions. COMMON BRAND NAME(S): Lexiscan What should I tell my health care provider before I take this medicine? They need to know if you have any of these conditions: -heart problems -lung or breathing disease, like asthma or COPD -an unusual or allergic reaction to regadenoson, other medicines, foods, dyes, or preservatives -pregnant or trying to get pregnant -breast-feeding How should I use this medicine? This medicine is for injection into a vein. It is given by a health care professional in a hospital or clinic setting. Talk to your pediatrician regarding the use of this medicine in children. Special care  may be needed. Overdosage: If you think you have taken too much of this medicine contact a poison control center or emergency room at once. NOTE: This medicine is only for you. Do not share this medicine with others. What if I miss a dose? This does not apply. What may interact with this medicine? -caffeine -dipyridamole -guarana -theophylline This list may not describe all possible interactions. Give your health care provider a list of all the medicines, herbs, non-prescription drugs, or dietary supplements you use. Also tell them if you smoke, drink alcohol, or use illegal drugs. Some items may interact with your medicine. What should I watch for while using this medicine? Your condition will be monitored carefully while you are receiving this medicine. Do not take medicines, foods, or drinks with caffeine (like coffee, tea, or colas) for at least 12 hours before your test. If you do not know if something contains caffeine, ask your health care professional. What side effects may I notice from receiving this medicine? Side effects that you should report to your doctor or health care professional as soon as possible: -allergic reactions like skin rash, itching or hives, swelling of the face, lips, or tongue -breathing problems   Cardiac Nuclear Scan A cardiac nuclear scan is a test that is done to  check the flow of blood to your heart. It is done when you are resting and when you are exercising. The test looks for problems such as:  Not enough blood reaching a portion of the heart.  The heart muscle not working as it should. You may need this test if:  You have heart disease.  You have had lab results that are not normal.  You have had heart surgery or a balloon procedure to open up blocked arteries (angioplasty).  You have chest pain.  You have shortness of breath. In this test, a special dye (tracer) is put into your bloodstream. The tracer will travel to your heart. A camera  will then take pictures of your heart to see how the tracer moves through your heart. This test is usually done at a hospital and takes 2-4 hours. Tell a doctor about:  Any allergies you have.  All medicines you are taking, including vitamins, herbs, eye drops, creams, and over-the-counter medicines.  Any problems you or family members have had with anesthetic medicines.  Any blood disorders you have.  Any surgeries you have had.  Any medical conditions you have.  Whether you are pregnant or may be pregnant. What are the risks? Generally, this is a safe test. However, problems may occur, such as:  Serious chest pain and heart attack. This is only a risk if the stress portion of the test is done.  Rapid heartbeat.  A feeling of warmth in your chest. This feeling usually does not last long.  Allergic reaction to the tracer. What happens before the test?  Ask your doctor about changing or stopping your normal medicines. This is important.  Follow instructions from your doctor about what you cannot eat or drink.  Remove your jewelry on the day of the test. What happens during the test?  An IV tube will be inserted into one of your veins.  Your doctor will give you a small amount of tracer through the IV tube.  You will wait for 20-40 minutes while the tracer moves through your bloodstream.  Your heart will be monitored with an electrocardiogram (ECG).  You will lie down on an exam table.  Pictures of your heart will be taken for about 15-20 minutes.  You may also have a stress test. For this test, one of these things may be done: ? You will be asked to exercise on a treadmill or a stationary bike. ? You will be given medicines that will make your heart work harder. This is done if you are unable to exercise.  When blood flow to your heart has peaked, a tracer will again be given through the IV tube.  After 20-40 minutes, you will get back on the exam table. More  pictures will be taken of your heart.  Depending on the tracer that is used, more pictures may need to be taken 3-4 hours later.  Your IV tube will be removed when the test is over. The test may vary among doctors and hospitals. What happens after the test?  Ask your doctor: ? Whether you can return to your normal schedule, including diet, activities, and medicines. ? Whether you should drink more fluids. This will help to remove the tracer from your body. Drink enough fluid to keep your pee (urine) pale yellow.  Ask your doctor, or the department that is doing the test: ? When will my results be ready? ? How will I get my results? Summary  A cardiac nuclear  scan is a test that is done to check the flow of blood to your heart.  Tell your doctor whether you are pregnant or may be pregnant.  Before the test, ask your doctor about changing or stopping your normal medicines. This is important.  Ask your doctor whether you can return to your normal activities. You may be asked to drink more fluids. This information is not intended to replace advice given to you by your health care provider. Make sure you discuss any questions you have with your health care provider. Document Released: 05/27/2018 Document Revised: 05/27/2018 Document Reviewed: 05/27/2018 Elsevier Interactive Patient Education  2019 Elsevier Inc.  -chest pain, tightness or palpitations -severe headache Side effects that usually do not require medical attention (report to your doctor or health care professional if they continue or are bothersome): -flushing -headache -irritation or pain at site where injected -nausea, vomiting This list may not describe all possible side effects. Call your doctor for medical advice about side effects. You may report side effects to FDA at 1-800-FDA-1088. Where should I keep my medicine? This drug is given in a hospital or clinic and will not be stored at home. NOTE: This sheet is a  summary. It may not cover all possible information. If you have questions about this medicine, talk to your doctor, pharmacist, or health care provider.  2019 Elsevier/Gold Standard (2008-08-10 15:08:13)  Elsevier Interactive Patient Education  [address] [employer].

## 2019-03-24 NOTE — Addendum Note (Signed)
Addended by: Pamala Hurry on: 03/24/2019 02:49 PM   Modules accepted: Orders

## 2019-03-24 NOTE — Telephone Encounter (Signed)
Priority 90 days

## 2019-03-26 ENCOUNTER — Ambulatory Visit: Payer: Medicare Other | Admitting: Cardiology

## 2019-07-08 ENCOUNTER — Ambulatory Visit: Payer: Medicare Other | Admitting: Cardiology

## 2019-07-17 ENCOUNTER — Other Ambulatory Visit: Payer: Self-pay

## 2019-07-17 ENCOUNTER — Ambulatory Visit (INDEPENDENT_AMBULATORY_CARE_PROVIDER_SITE_OTHER): Payer: Medicare Other | Admitting: Cardiology

## 2019-07-17 ENCOUNTER — Encounter: Payer: Self-pay | Admitting: Cardiology

## 2019-07-17 VITALS — BP 128/76 | HR 74 | Ht 65.0 in | Wt 247.0 lb

## 2019-07-17 DIAGNOSIS — R079 Chest pain, unspecified: Secondary | ICD-10-CM

## 2019-07-17 DIAGNOSIS — I10 Essential (primary) hypertension: Secondary | ICD-10-CM | POA: Diagnosis not present

## 2019-07-17 DIAGNOSIS — E782 Mixed hyperlipidemia: Secondary | ICD-10-CM | POA: Diagnosis not present

## 2019-07-17 DIAGNOSIS — R0789 Other chest pain: Secondary | ICD-10-CM

## 2019-07-17 NOTE — Patient Instructions (Addendum)
Medication Instructions:  Your physician recommends that you continue on your current medications as directed. Please refer to the Current Medication list given to you today.  If you need a refill on your cardiac medications before your next appointment, please call your pharmacy.   Lab work: NONE If you have labs (blood work) drawn today and your tests are completely normal, you will receive your results only by: . MyChart Message (if you have MyChart) OR . A paper copy in the mail If you have any lab test that is abnormal or we need to change your treatment, we will call you to review the results.  Testing/Procedures: You had an EKG performed today.  Your physician has requested that you have an echocardiogram. Echocardiography is a painless test that uses sound waves to create images of your heart. It provides your doctor with information about the size and shape of your heart and how well your heart's chambers and valves are working. This procedure takes approximately one hour. There are no restrictions for this procedure.  Your physician has requested that you have a lexiscan myoview. For further information please visit [email or web address]. Please follow instruction sheet, as given.    Follow-Up: At [facility], you and your health needs are our priority.  As part of our continuing mission to provide you with exceptional heart care, we have created designated Provider Care Teams.  These Care Teams include your primary Cardiologist (physician) and Advanced Practice Providers (APPs -  Physician Assistants and Nurse Practitioners) who all work together to provide you with the care you need, when you need it. You will need a follow up appointment in 4 months.   Any Other Special Instructions Will Be Listed Below  Regadenoson injection What is this medicine? REGADENOSON is used to test the heart for coronary artery disease. It is used in patients who can not exercise for their stress  test. This medicine may be used for other purposes; ask your health care provider or pharmacist if you have questions. COMMON BRAND NAME(S): Lexiscan What should I tell my health care provider before I take this medicine? They need to know if you have any of these conditions:  heart problems  lung or breathing disease, like asthma or COPD  an unusual or allergic reaction to regadenoson, other medicines, foods, dyes, or preservatives  pregnant or trying to get pregnant  breast-feeding How should I use this medicine? This medicine is for injection into a vein. It is given by a health care professional in a hospital or clinic setting. Talk to your pediatrician regarding the use of this medicine in children. Special care may be needed. Overdosage: If you think you have taken too much of this medicine contact a poison control center or emergency room at once. NOTE: This medicine is only for you. Do not share this medicine with others. What if I miss a dose? This does not apply. What may interact with this medicine?  caffeine  dipyridamole  guarana  theophylline This list may not describe all possible interactions. Give your health care provider a list of all the medicines, herbs, non-prescription drugs, or dietary supplements you use. Also tell them if you smoke, drink alcohol, or use illegal drugs. Some items may interact with your medicine. What should I watch for while using this medicine? Your condition will be monitored carefully while you are receiving this medicine. Do not take medicines, foods, or drinks with caffeine (like coffee, tea, or colas) for at least 12 hours   before your test. If you do not know if something contains caffeine, ask your health care professional. What side effects may I notice from receiving this medicine? Side effects that you should report to your doctor or health care professional as soon as possible:  allergic reactions like skin rash, itching or  hives, swelling of the face, lips, or tongue  breathing problems  chest pain, tightness or palpitations  severe headache Side effects that usually do not require medical attention (report to your doctor or health care professional if they continue or are bothersome):  flushing  headache  irritation or pain at site where injected  nausea, vomiting This list may not describe all possible side effects. Call your doctor for medical advice about side effects. You may report side effects to FDA at 1-800-FDA-1088. Where should I keep my medicine? This drug is given in a hospital or clinic and will not be stored at home. NOTE: This sheet is a summary. It may not cover all possible information. If you have questions about this medicine, talk to your doctor, pharmacist, or health care provider.  2020 Elsevier/Gold Standard (2008-08-10 15:08:13)  Cardiac Nuclear Scan A cardiac nuclear scan is a test that is done to check the flow of blood to your heart. It is done when you are resting and when you are exercising. The test looks for problems such as:  Not enough blood reaching a portion of the heart.  The heart muscle not working as it should. You may need this test if:  You have heart disease.  You have had lab results that are not normal.  You have had heart surgery or a balloon procedure to open up blocked arteries (angioplasty).  You have chest pain.  You have shortness of breath. In this test, a special dye (tracer) is put into your bloodstream. The tracer will travel to your heart. A camera will then take pictures of your heart to see how the tracer moves through your heart. This test is usually done at a hospital and takes 2-4 hours. Tell a doctor about:  Any allergies you have.  All medicines you are taking, including vitamins, herbs, eye drops, creams, and over-the-counter medicines.  Any problems you or family members have had with anesthetic medicines.  Any blood  disorders you have.  Any surgeries you have had.  Any medical conditions you have.  Whether you are pregnant or may be pregnant. What are the risks? Generally, this is a safe test. However, problems may occur, such as:  Serious chest pain and heart attack. This is only a risk if the stress portion of the test is done.  Rapid heartbeat.  A feeling of warmth in your chest. This feeling usually does not last long.  Allergic reaction to the tracer. What happens before the test?  Ask your doctor about changing or stopping your normal medicines. This is important.  Follow instructions from your doctor about what you cannot eat or drink.  Remove your jewelry on the day of the test. What happens during the test?  An IV tube will be inserted into one of your veins.  Your doctor will give you a small amount of tracer through the IV tube.  You will wait for 20-40 minutes while the tracer moves through your bloodstream.  Your heart will be monitored with an electrocardiogram (ECG).  You will lie down on an exam table.  Pictures of your heart will be taken for about 15-20 minutes.  You may   also have a stress test. For this test, one of these things may be done: ? You will be asked to exercise on a treadmill or a stationary bike. ? You will be given medicines that will make your heart work harder. This is done if you are unable to exercise.  When blood flow to your heart has peaked, a tracer will again be given through the IV tube.  After 20-40 minutes, you will get back on the exam table. More pictures will be taken of your heart.  Depending on the tracer that is used, more pictures may need to be taken 3-4 hours later.  Your IV tube will be removed when the test is over. The test may vary among doctors and hospitals. What happens after the test?  Ask your doctor: ? Whether you can return to your normal schedule, including diet, activities, and medicines. ? Whether you should  drink more fluids. This will help to remove the tracer from your body. Drink enough fluid to keep your pee (urine) pale yellow.  Ask your doctor, or the department that is doing the test: ? When will my results be ready? ? How will I get my results? Summary  A cardiac nuclear scan is a test that is done to check the flow of blood to your heart.  Tell your doctor whether you are pregnant or may be pregnant.  Before the test, ask your doctor about changing or stopping your normal medicines. This is important.  Ask your doctor whether you can return to your normal activities. You may be asked to drink more fluids. This information is not intended to replace advice given to you by your health care provider. Make sure you discuss any questions you have with your health care provider. Document Released: 05/27/2018 Document Revised: 04/02/2019 Document Reviewed: 05/27/2018 Elsevier Patient Education  2020 Elsevier Inc.  Echocardiogram An echocardiogram is a procedure that uses painless sound waves (ultrasound) to produce an image of the heart. Images from an echocardiogram can provide important information about:  Signs of coronary artery disease (CAD).  Aneurysm detection. An aneurysm is a weak or damaged part of an artery wall that bulges out from the normal force of blood pumping through the body.  Heart size and shape. Changes in the size or shape of the heart can be associated with certain conditions, including heart failure, aneurysm, and CAD.  Heart muscle function.  Heart valve function.  Signs of a past heart attack.  Fluid buildup around the heart.  Thickening of the heart muscle.  A tumor or infectious growth around the heart valves. Tell a health care provider about:  Any allergies you have.  All medicines you are taking, including vitamins, herbs, eye drops, creams, and over-the-counter medicines.  Any blood disorders you have.  Any surgeries you have had.  Any  medical conditions you have.  Whether you are pregnant or may be pregnant. What are the risks? Generally, this is a safe procedure. However, problems may occur, including:  Allergic reaction to dye (contrast) that may be used during the procedure. What happens before the procedure? No specific preparation is needed. You may eat and drink normally. What happens during the procedure?   An IV tube may be inserted into one of your veins.  You may receive contrast through this tube. A contrast is an injection that improves the quality of the pictures from your heart.  A gel will be applied to your chest.  A wand-like tool (transducer)   will be moved over your chest. The gel will help to transmit the sound waves from the transducer.  The sound waves will harmlessly bounce off of your heart to allow the heart images to be captured in real-time motion. The images will be recorded on a computer. The procedure may vary among health care providers and hospitals. What happens after the procedure?  You may return to your normal, everyday life, including diet, activities, and medicines, unless your health care provider tells you not to do that. Summary  An echocardiogram is a procedure that uses painless sound waves (ultrasound) to produce an image of the heart.  Images from an echocardiogram can provide important information about the size and shape of your heart, heart muscle function, heart valve function, and fluid buildup around your heart.  You do not need to do anything to prepare before this procedure. You may eat and drink normally.  After the echocardiogram is completed, you may return to your normal, everyday life, unless your health care provider tells you not to do that. This information is not intended to replace advice given to you by your health care provider. Make sure you discuss any questions you have with your health care provider. Document Released: 12/08/2000 Document  Revised: 04/03/2019 Document Reviewed: 01/13/2017 Elsevier Patient Education  2020 Elsevier Inc.  

## 2019-07-17 NOTE — Progress Notes (Signed)
Cardiology Office Note:    Date:  07/17/2019   ID:  Jillian Taylor, DOB 15-Apr-1951, MRN [id number]  PCP:  Ronal FearLam, Lynn E, NP  Cardiologist:  Garwin Brothersajan R Daevion Navarette, MD   Referring MD: Ailene RavelHamrick, Maura L, MD    ASSESSMENT:    1. Chest discomfort   2. Essential hypertension   3. Morbid obesity (HCC)   4. Mixed dyslipidemia    PLAN:    In order of problems listed above:  1. Chest discomfort: I discussed with the patient my findings.  They are atypical for angina however in view of risk factors she will undergo Lexiscan sestamibi. 2. Essential hypertension: Blood pressure stable.  Weight reduction was stressed for obesity and risks of obesity explained and she vocalized understanding.  Echocardiogram will be done to assess murmur heard on auscultation. 3. Mixed dyslipidemia: Lipids will be followed by primary care physician. 4. Patient will be seen in follow-up appointment in 4 months or earlier if the patient has any concerns    Medication Adjustments/Labs and Tests Ordered: Current medicines are reviewed at length with the patient today.  Concerns regarding medicines are outlined above.  No orders of the defined types were placed in this encounter.  No orders of the defined types were placed in this encounter.    No chief complaint on file.    History of Present Illness:    Jillian Taylor is a 68 y.o. female.  Patient has history of essential hypertension and dyslipidemia.  She was evaluated by me for chest discomfort.  She has visual impairment.  She leads a sedentary lifestyle and is morbidly obese.  She complains of chest discomfort at times not related to exertion and burning in nature.  We had not evaluated her last time because of the pandemic situation.  She wants to be evaluated for this.  At the time of my evaluation, the patient is alert awake oriented and in no distress.  Past Medical History:  Diagnosis Date  . Arthritis   . Asthma   . Blind   . Eczema     Past  Surgical History:  Procedure Laterality Date  . CHOLECYSTECTOMY    . EYE SURGERY    . LUNG BIOPSY    . TONSILLECTOMY      Current Medications: Current Meds  Medication Sig  . albuterol (PROAIR HFA) 108 (90 BASE) MCG/ACT inhaler Inhale 2 puffs into the lungs every 4 (four) hours as needed for wheezing or shortness of breath.  . cetirizine (ZYRTEC) 10 MG tablet Take 10 mg by mouth daily as needed for allergies.  . Cholecalciferol (VITAMIN D PO) Take 1 capsule by mouth daily.  . Cyanocobalamin (B-12 PO) Take 1 tablet by mouth daily.  . diazepam (VALIUM) 2 MG tablet Take 2 mg by mouth 2 (two) times daily.  Marland Kitchen. ipratropium-albuterol (DUONEB) 0.5-2.5 (3) MG/3ML SOLN Take 3 mLs by nebulization every 4 (four) hours as needed.  . pantoprazole (PROTONIX) 40 MG tablet Take 40 mg by mouth daily.  Marland Kitchen. PRED FORTE 1 % ophthalmic suspension 1 drop in the left eye 4 times daily and 1 drop in the right eye 3 times      Allergies:   Methotrexate derivatives and Penicillins   Social History   Socioeconomic History  . Marital status: Single    Spouse name: Not on file  . Number of children: Not on file  . Years of education: Not on file  . Highest education level: Not on file  Occupational History  . Not on file  Social Needs  . Financial resource strain: Not on file  . Food insecurity    Worry: Not on file    Inability: Not on file  . Transportation needs    Medical: Not on file    Non-medical: Not on file  Tobacco Use  . Smoking status: Never Smoker  . Smokeless tobacco: Never Used  Substance and Sexual Activity  . Alcohol use: Yes    Alcohol/week: 2.0 standard drinks    Types: 1 Glasses of wine, 1 Cans of beer per week  . Drug use: No  . Sexual activity: Not on file  Lifestyle  . Physical activity    Days per week: Not on file    Minutes per session: Not on file  . Stress: Not on file  Relationships  . Social Herbalist on phone: Not on file    Gets together: Not on file     Attends religious service: Not on file    Active member of club or organization: Not on file    Attends meetings of clubs or organizations: Not on file    Relationship status: Not on file  Other Topics Concern  . Not on file  Social History Narrative  . Not on file     Family History: The patient's family history includes Allergic Disorder in her brother; Allergic rhinitis in her sister; Asthma in her brother, sister, and sister.  ROS:   Please see the history of present illness.    All other systems reviewed and are negative.  EKGs/Labs/Other Studies Reviewed:    The following studies were reviewed today: I reviewed available blood work reports   Recent Labs: No results found for requested labs within last 8760 hours.  Recent Lipid Panel    Component Value Date/Time   CHOL 197 06/09/2008 2014   TRIG 101 06/09/2008 2014   HDL 73 06/09/2008 2014   CHOLHDL 2.7 Ratio 06/09/2008 2014   VLDL 20 06/09/2008 2014   LDLCALC 104 (H) 06/09/2008 2014    Physical Exam:    VS:  BP 128/76 (BP Location: Left Arm, Patient Position: Sitting, Cuff Size: Normal)   Pulse 74   Ht 5\' 5"  (1.651 m)   Wt 247 lb (112 kg)   SpO2 99%   BMI 41.10 kg/m     Wt Readings from Last 3 Encounters:  07/17/19 247 lb (112 kg)  03/24/19 240 lb (108.9 kg)  11/08/15 211 lb (95.7 kg)     GEN: Patient is in no acute distress HEENT: Normal NECK: No JVD; No carotid bruits LYMPHATICS: No lymphadenopathy CARDIAC: Hear sounds regular, 2/6 systolic murmur at the apex. RESPIRATORY:  Clear to auscultation without rales, wheezing or rhonchi  ABDOMEN: Soft, non-tender, non-distended MUSCULOSKELETAL:  No edema; No deformity  SKIN: Warm and dry NEUROLOGIC:  Alert and oriented x 3 PSYCHIATRIC:  Normal affect   Signed, Jenean Lindau, MD  07/17/2019 2:13 PM    [facility]

## 2019-07-17 NOTE — Addendum Note (Signed)
Addended by: Beckey Rutter on: 07/17/2019 02:29 PM   Modules accepted: Orders

## 2019-08-19 ENCOUNTER — Telehealth (HOSPITAL_COMMUNITY): Payer: Self-pay | Admitting: *Deleted

## 2019-08-19 NOTE — Telephone Encounter (Signed)
Patient given detailed instructions per Myocardial Perfusion Study Information Sheet for the test on 08/26/19 Patient notified to arrive 15 minutes early and that it is imperative to arrive on time for appointment to keep from having the test rescheduled.  If you need to cancel or reschedule your appointment, please call the office within 24 hours of your appointment. . Patient verbalized understanding. An Jillian Taylor    

## 2019-08-21 ENCOUNTER — Other Ambulatory Visit: Payer: Self-pay

## 2019-08-21 ENCOUNTER — Ambulatory Visit (INDEPENDENT_AMBULATORY_CARE_PROVIDER_SITE_OTHER): Payer: Medicare Other

## 2019-08-21 DIAGNOSIS — R0789 Other chest pain: Secondary | ICD-10-CM

## 2019-08-21 DIAGNOSIS — R079 Chest pain, unspecified: Secondary | ICD-10-CM

## 2019-08-21 NOTE — Progress Notes (Signed)
Complete echocardiogram has been performed.  Jimmy Kenzlee Fishburn RDCS, RVT 

## 2019-08-26 ENCOUNTER — Telehealth: Payer: Self-pay | Admitting: *Deleted

## 2019-08-26 ENCOUNTER — Ambulatory Visit (INDEPENDENT_AMBULATORY_CARE_PROVIDER_SITE_OTHER): Payer: Medicare Other

## 2019-08-26 ENCOUNTER — Other Ambulatory Visit: Payer: Self-pay

## 2019-08-26 DIAGNOSIS — R079 Chest pain, unspecified: Secondary | ICD-10-CM

## 2019-08-26 LAB — MYOCARDIAL PERFUSION IMAGING
LV dias vol: 65 mL (ref 46–106)
LV sys vol: 16 mL
Peak HR: 118 {beats}/min
Rest HR: 72 {beats}/min
SDS: 2
SRS: 2
SSS: 4
TID: 1.03

## 2019-08-26 MED ORDER — TECHNETIUM TC 99M TETROFOSMIN IV KIT
32.4000 | PACK | Freq: Once | INTRAVENOUS | Status: AC | PRN
  Administered 2019-08-26: 32.4 via INTRAVENOUS

## 2019-08-26 MED ORDER — TECHNETIUM TC 99M TETROFOSMIN IV KIT
10.5000 | PACK | Freq: Once | INTRAVENOUS | Status: AC | PRN
  Administered 2019-08-26: 10.5 via INTRAVENOUS

## 2019-08-26 MED ORDER — REGADENOSON 0.4 MG/5ML IV SOLN
0.4000 mg | Freq: Once | INTRAVENOUS | Status: AC
  Administered 2019-08-26: 0.4 mg via INTRAVENOUS

## 2019-08-26 NOTE — Telephone Encounter (Signed)
-----   Message from Jenean Lindau, MD sent at 08/23/2019  8:59 AM EDT ----- The results of the study is unremarkable.  Moderate left ventricular hypertrophy.  Needs to be careful with diet and needs to exercise regularly.  Please inform patient. I will discuss in detail at next appointment. Cc  primary care/referring physician Jenean Lindau, MD 08/23/2019 8:58 AM

## 2019-08-26 NOTE — Telephone Encounter (Signed)
Telephone call to patient. Left message per DPR of echo results and need to watch diet,exercise regularly and keep 10/2019 appointment

## 2019-08-28 ENCOUNTER — Telehealth: Payer: Self-pay

## 2019-08-28 NOTE — Telephone Encounter (Signed)
Information relayed, copy sent to Dr. Chauncy Passy per Dr. Docia Furl.

## 2019-08-28 NOTE — Telephone Encounter (Signed)
-----   Message from Jenean Lindau, MD sent at 08/26/2019  2:35 PM EDT ----- The results of the study is unremarkable. Please inform patient. I will discuss in detail at next appointment. Cc  primary care/referring physician Jenean Lindau, MD 08/26/2019 2:35 PM

## 2019-10-23 ENCOUNTER — Ambulatory Visit (INDEPENDENT_AMBULATORY_CARE_PROVIDER_SITE_OTHER): Payer: Medicare Other | Admitting: Sports Medicine

## 2019-10-23 ENCOUNTER — Other Ambulatory Visit: Payer: Self-pay | Admitting: Sports Medicine

## 2019-10-23 ENCOUNTER — Other Ambulatory Visit: Payer: Self-pay

## 2019-10-23 ENCOUNTER — Encounter: Payer: Self-pay | Admitting: Sports Medicine

## 2019-10-23 DIAGNOSIS — M79671 Pain in right foot: Secondary | ICD-10-CM

## 2019-10-23 DIAGNOSIS — H540X33 Blindness right eye category 3, blindness left eye category 3: Secondary | ICD-10-CM

## 2019-10-23 DIAGNOSIS — M21619 Bunion of unspecified foot: Secondary | ICD-10-CM

## 2019-10-23 NOTE — Patient Instructions (Signed)
Bunion  A bunion is a bump on the base of the big toe that forms when the bones of the big toe joint move out of position. Bunions may be small at first, but they often get larger over time. They can make walking painful. What are the causes? A bunion may be caused by:  Wearing narrow or pointed shoes that force the big toe to press against the other toes.  Abnormal foot development that causes the foot to roll inward (pronate).  Changes in the foot that are caused by certain diseases, such as rheumatoid arthritis or polio.  A foot injury. What increases the risk? The following factors may make you more likely to develop this condition:  Wearing shoes that squeeze the toes together.  Having certain diseases, such as: ? Rheumatoid arthritis. ? Polio. ? Cerebral palsy.  Having family members who have bunions.  Being born with a foot deformity, such as flat feet or low arches.  Doing activities that put a lot of pressure on the feet, such as ballet dancing. What are the signs or symptoms? The main symptom of a bunion is a noticeable bump on the big toe. Other symptoms may include:  Pain.  Swelling around the big toe.  Redness and inflammation.  Thick or hardened skin on the big toe or between the toes.  Stiffness or loss of motion in the big toe.  Trouble with walking. How is this diagnosed? A bunion may be diagnosed based on your symptoms, medical history, and activities. You may have tests, such as:  X-rays. These allow your health care provider to check the position of the bones in your foot and look for damage to your joint. They also help your health care provider determine the severity of your bunion and the best way to treat it.  Joint aspiration. In this test, a sample of fluid is removed from the toe joint. This test may be done if you are in a lot of pain. It helps rule out diseases that cause painful swelling of the joints, such as arthritis. How is this  treated? Treatment depends on the severity of your symptoms. The goal of treatment is to relieve symptoms and prevent the bunion from getting worse. Your health care provider may recommend:  Wearing shoes that have a wide toe box.  Using bunion pads to cushion the affected area.  Taping your toes together to keep them in a normal position.  Placing a device inside your shoe (orthotics) to help reduce pressure on your toe joint.  Taking medicine to ease pain, inflammation, and swelling.  Applying heat or ice to the affected area.  Doing stretching exercises.  Surgery to remove scar tissue and move the toes back into their normal position. This treatment is rare. Follow these instructions at home: Managing pain, stiffness, and swelling   If directed, put ice on the painful area: ? Put ice in a plastic bag. ? Place a towel between your skin and the bag. ? Leave the ice on for 20 minutes, 2-3 times a day. Activity   If directed, apply heat to the affected area before you exercise. Use the heat source that your health care provider recommends, such as a moist heat pack or a heating pad. ? Place a towel between your skin and the heat source. ? Leave the heat on for 20-30 minutes. ? Remove the heat if your skin turns bright red. This is especially important if you are unable to feel pain,   heat, or cold. You may have a greater risk of getting burned.  Do exercises as told by your health care provider. General instructions  Support your toe joint with proper footwear, shoe padding, or taping as told by your health care provider.  Take over-the-counter and prescription medicines only as told by your health care provider.  Keep all follow-up visits as told by your health care provider. This is important. Contact a health care provider if your symptoms:  Get worse.  Do not improve in 2 weeks. Get help right away if you have:  Severe pain and trouble with walking. Summary  A  bunion is a bump on the base of the big toe that forms when the bones of the big toe joint move out of position.  Bunions can make walking painful.  Treatment depends on the severity of your symptoms.  Support your toe joint with proper footwear, shoe padding, or taping as told by your health care provider. This information is not intended to replace advice given to you by your health care provider. Make sure you discuss any questions you have with your health care provider. Document Released: 12/11/2005 Document Revised: 06/17/2018 Document Reviewed: 04/23/2018 Elsevier Patient Education  2020 Elsevier Inc.  

## 2019-10-23 NOTE — Progress Notes (Signed)
Subjective: Jillian Taylor is a 68 y.o. female patient who presents to office for evaluation of Right bunion pain. Patient complains of progressive pain especially over the last year in the Right foot that starts as pain over the bump with direct pressure and range of motion; patient now has difficulty fitting shoes comfortably. Ranks pain 8/10 and is now interferring with daily activities.  Patient has also tried soaking with Epsom salt alcohol. Patient denies any other pedal complaints.   Review of Systems  All other systems reviewed and are negative.   Patient Active Problem List   Diagnosis Date Noted  . Essential hypertension 03/24/2019  . Overweight 03/24/2019  . Chest discomfort 03/24/2019  . Mixed dyslipidemia 03/24/2019  . Allergic rhinoconjunctivitis 11/08/2015  . Aseptic necrosis of head or neck of femur 12/08/2011  . Morbid obesity (HCC) 12/08/2011  . ANXIETY DEPRESSION 07/02/2007  . UVEITIS 07/02/2007  . Mild intermittent asthma 07/02/2007  . PULMONARY NODULE 07/02/2007  . CONSTIPATION, RECURRENT 07/02/2007  . MENOPAUSAL SYNDROME 07/02/2007  . ALOPECIA AREATA 07/02/2007  . BACK PAIN, LUMBAR, CHRONIC 07/02/2007  . LPRD (laryngopharyngeal reflux disease) 07/02/2007  . TOBACCO USE, QUIT 07/02/2007    Current Outpatient Medications on File Prior to Visit  Medication Sig Dispense Refill  . albuterol (PROAIR HFA) 108 (90 BASE) MCG/ACT inhaler Inhale 2 puffs into the lungs every 4 (four) hours as needed for wheezing or shortness of breath.    Marland Kitchen amLODipine (NORVASC) 2.5 MG tablet Take 1 tablet (2.5 mg total) by mouth daily. 180 tablet 3  . cetirizine (ZYRTEC) 10 MG tablet Take 10 mg by mouth daily as needed for allergies.    . Cholecalciferol (VITAMIN D PO) Take 1 capsule by mouth daily.    . Cyanocobalamin (B-12 PO) Take 1 tablet by mouth daily.    . diazepam (VALIUM) 2 MG tablet Take 2 mg by mouth 2 (two) times daily.    Marland Kitchen ipratropium-albuterol (DUONEB) 0.5-2.5 (3) MG/3ML  SOLN Take 3 mLs by nebulization every 4 (four) hours as needed. 360 mL 3  . pantoprazole (PROTONIX) 40 MG tablet Take 40 mg by mouth daily.    Marland Kitchen PRED FORTE 1 % ophthalmic suspension 1 drop in the left eye 4 times daily and 1 drop in the right eye 3 times      No current facility-administered medications on file prior to visit.     Allergies  Allergen Reactions  . Methotrexate Derivatives Hives  . Penicillins Hives    Has patient had a PCN reaction causing immediate rash, facial/tongue/throat swelling, SOB or lightheadedness with hypotension: Yes Has patient had a PCN reaction causing severe rash involving mucus membranes or skin necrosis: Yes Has patient had a PCN reaction that required hospitalization No Has patient had a PCN reaction occurring within the last 10 years: No If all of the above answers are "NO", then may proceed with Cephalosporin use.     Objective:  General: Alert and oriented x3 in no acute distress, BLIND  Dermatology: No open lesions bilateral lower extremities, no webspace macerations, no ecchymosis bilateral, all nails x 10 are well manicured.  Vascular: Dorsalis Pedis 2/4 and Posterior Tibial pedal pulses 1/4, Capillary Fill Time 3 seconds, (+) pedal hair growth bilateral, no edema bilateral lower extremities, Temperature gradient within normal limits.  Neurology: Michaell Cowing sensation intact via light touch bilateral.  Musculoskeletal: Mild tenderness with palpation right bunion deformity, there is medial arch collapse Right supportive of pes planus, + HAV deformity supported on ground  with no second toe crossover deformity noted.   Assessment and Plan: Problem List Items Addressed This Visit    None    Visit Diagnoses    Bunion    -  Primary   Right foot pain       Category 3 blindness of both eyes          -Complete examination performed -Patient declined Xrays  -Dispensed bunion padding sleeve for patient to use as instructed; aide was in room and also  educated on how to properly use with bunion padding -Recommend continue with good supportive shoes and topical pain cream or rub since can not take NSAIDs or Steroids  -Patient to return to office as needed if pain is not better after 4 weeks or sooner if condition worsens.  Landis Martins, DPM

## 2019-11-17 ENCOUNTER — Encounter: Payer: Self-pay | Admitting: Cardiology

## 2019-11-17 ENCOUNTER — Other Ambulatory Visit: Payer: Self-pay

## 2019-11-17 ENCOUNTER — Ambulatory Visit (INDEPENDENT_AMBULATORY_CARE_PROVIDER_SITE_OTHER): Payer: Medicare Other | Admitting: Cardiology

## 2019-11-17 VITALS — BP 112/70 | HR 93 | Ht 65.0 in | Wt 245.0 lb

## 2019-11-17 DIAGNOSIS — E782 Mixed hyperlipidemia: Secondary | ICD-10-CM | POA: Diagnosis not present

## 2019-11-17 DIAGNOSIS — R0789 Other chest pain: Secondary | ICD-10-CM | POA: Diagnosis not present

## 2019-11-17 NOTE — Progress Notes (Signed)
Cardiology Office Note:    Date:  11/17/2019   ID:  Jillian Taylor, DOB Oct 14, 1951, MRN [id number]  PCP:  Ronal Fear, NP  Cardiologist:  Garwin Brothers, MD   Referring MD: Ronal Fear, NP    ASSESSMENT:    1. Mixed dyslipidemia   2. Chest discomfort    PLAN:    In order of problems listed above:  1. Chest discomfort: Patient was evaluated with a stress test which was negative for ischemia.  Ejection fraction was normal.  Also echocardiogram was within normal limits.  I discussed my findings with the patient at extensive length.  Echocardiogram report was also discussed.  Blood pressure stable. 2. Morbid obesity: Diet was discussed and weight reduction was stressed.  Risks of obesity explained and she vocalized understanding. 3. Patient will be seen in follow-up appointment in 6 months or earlier if the patient has any concerns    Medication Adjustments/Labs and Tests Ordered: Current medicines are reviewed at length with the patient today.  Concerns regarding medicines are outlined above.  No orders of the defined types were placed in this encounter.  No orders of the defined types were placed in this encounter.    Chief Complaint  Patient presents with  . Follow-up     History of Present Illness:    Jillian Taylor is a 68 y.o. female.  Patient has past medical history that is not much significant.  She is morbidly obese and leads a sedentary lifestyle.  She has orthopedic issues involving her hip.  She denies any chest pain orthopnea or PND.  At the time of my evaluation, the patient is alert awake oriented and in no distress.  Past Medical History:  Diagnosis Date  . Arthritis   . Asthma   . Blind   . Eczema     Past Surgical History:  Procedure Laterality Date  . CHOLECYSTECTOMY    . EYE SURGERY    . LUNG BIOPSY    . TONSILLECTOMY      Current Medications: Current Meds  Medication Sig  . albuterol (PROAIR HFA) 108 (90 BASE) MCG/ACT inhaler  Inhale 2 puffs into the lungs every 4 (four) hours as needed for wheezing or shortness of breath.  Marland Kitchen amLODipine (NORVASC) 2.5 MG tablet Take 1 tablet (2.5 mg total) by mouth daily.  . cetirizine (ZYRTEC) 10 MG tablet Take 10 mg by mouth daily as needed for allergies.  . Cholecalciferol (VITAMIN D PO) Take 1 capsule by mouth daily.  . Cyanocobalamin (B-12 PO) Take 1 tablet by mouth daily.  Marland Kitchen HYDROcodone-acetaminophen (NORCO/VICODIN) 5-325 MG tablet Take 1 tablet by mouth 2 (two) times daily as needed.  . pantoprazole (PROTONIX) 40 MG tablet Take 40 mg by mouth daily.  Marland Kitchen PRED FORTE 1 % ophthalmic suspension 1 drop in the left eye 4 times daily and 1 drop in the right eye 3 times      Allergies:   Methotrexate derivatives and Penicillins   Social History   Socioeconomic History  . Marital status: Single    Spouse name: Not on file  . Number of children: Not on file  . Years of education: Not on file  . Highest education level: Not on file  Occupational History  . Not on file  Social Needs  . Financial resource strain: Not on file  . Food insecurity    Worry: Not on file    Inability: Not on file  . Transportation needs  Medical: Not on file    Non-medical: Not on file  Tobacco Use  . Smoking status: Never Smoker  . Smokeless tobacco: Never Used  Substance and Sexual Activity  . Alcohol use: Yes    Alcohol/week: 2.0 standard drinks    Types: 1 Glasses of wine, 1 Cans of beer per week  . Drug use: No  . Sexual activity: Not on file  Lifestyle  . Physical activity    Days per week: Not on file    Minutes per session: Not on file  . Stress: Not on file  Relationships  . Social Musicianconnections    Talks on phone: Not on file    Gets together: Not on file    Attends religious service: Not on file    Active member of club or organization: Not on file    Attends meetings of clubs or organizations: Not on file    Relationship status: Not on file  Other Topics Concern  . Not on  file  Social History Narrative  . Not on file     Family History: The patient's family history includes Allergic Disorder in her brother; Allergic rhinitis in her sister; Asthma in her brother, sister, and sister.  ROS:   Please see the history of present illness.    All other systems reviewed and are negative.  EKGs/Labs/Other Studies Reviewed:    The following studies were reviewed today: IMPRESSIONS    1. The left ventricle has normal systolic function with an ejection fraction of 60-65%. The cavity size was normal. There is moderate concentric left ventricular hypertrophy. Left ventricular diastolic Doppler parameters are consistent with  pseudonormalization. No evidence of left ventricular regional wall motion abnormalities.  2. The right ventricle has normal systolic function. The cavity was normal. There is no increase in right ventricular wall thickness.  3. No evidence of mitral valve stenosis.  4. The aortic valve is tricuspid. No stenosis of the aortic valve.  5. The aortic root and ascending aorta are normal in size and structure.  Study Highlights   The left ventricular ejection fraction is hyperdynamic (>65%).  Nuclear stress EF: 75%.  There was no ST segment deviation noted during stress.  The study is normal.  This is a low risk study.         Recent Labs: No results found for requested labs within last 8760 hours.  Recent Lipid Panel    Component Value Date/Time   CHOL 197 06/09/2008 2014   TRIG 101 06/09/2008 2014   HDL 73 06/09/2008 2014   CHOLHDL 2.7 Ratio 06/09/2008 2014   VLDL 20 06/09/2008 2014   LDLCALC 104 (H) 06/09/2008 2014    Physical Exam:    VS:  BP 112/70   Pulse 93   Ht 5\' 5"  (1.651 m)   Wt 245 lb (111.1 kg)   SpO2 99%   BMI 40.77 kg/m     Wt Readings from Last 3 Encounters:  11/17/19 245 lb (111.1 kg)  08/26/19 247 lb (112 kg)  07/17/19 247 lb (112 kg)     GEN: Patient is in no acute distress HEENT: Normal  NECK: No JVD; No carotid bruits LYMPHATICS: No lymphadenopathy CARDIAC: Hear sounds regular, 2/6 systolic murmur at the apex. RESPIRATORY:  Clear to auscultation without rales, wheezing or rhonchi  ABDOMEN: Soft, non-tender, non-distended MUSCULOSKELETAL:  No edema; No deformity  SKIN: Warm and dry NEUROLOGIC:  Alert and oriented x 3 PSYCHIATRIC:  Normal affect   Signed, Aundra DubinRajan R  Tahja Liao, MD  11/17/2019 1:22 PM    [facility]

## 2019-11-17 NOTE — Patient Instructions (Signed)
Medication Instructions:  Your physician recommends that you continue on your current medications as directed. Please refer to the Current Medication list given to you today.  *If you need a refill on your cardiac medications before your next appointment, please call your pharmacy*  Lab Work: NONE If you have labs (blood work) drawn today and your tests are completely normal, you will receive your results only by: . MyChart Message (if you have MyChart) OR . A paper copy in the mail If you have any lab test that is abnormal or we need to change your treatment, we will call you to review the results.  Testing/Procedures: NONE  Follow-Up: At [facility], you and your health needs are our priority.  As part of our continuing mission to provide you with exceptional heart care, we have created designated Provider Care Teams.  These Care Teams include your primary Cardiologist (physician) and Advanced Practice Providers (APPs -  Physician Assistants and Nurse Practitioners) who all work together to provide you with the care you need, when you need it.  Your next appointment:   9 month(s)  The format for your next appointment:   In Person  Provider:   Rajan Revankar, MD   

## 2020-03-27 ENCOUNTER — Other Ambulatory Visit: Payer: Self-pay | Admitting: Cardiology

## 2020-04-02 ENCOUNTER — Ambulatory Visit: Payer: Medicare Other | Attending: Internal Medicine

## 2020-04-02 DIAGNOSIS — Z23 Encounter for immunization: Secondary | ICD-10-CM

## 2020-04-02 NOTE — Progress Notes (Signed)
   Covid-19 Vaccination Clinic  Name:  Jillian Taylor    MRN: [id number] DOB: 1951/06/12  04/02/2020  [name] was observed post Covid-19 immunization for 15 minutes without incident. She was provided with Vaccine Information Sheet and instruction to access the V-Safe system.   [name] was instructed to call 911 with any severe reactions post vaccine: Marland Kitchen Difficulty breathing  . Swelling of face and throat  . A fast heartbeat  . A bad rash all over body  . Dizziness and weakness   Immunizations Administered    Name Date Dose VIS Date Route   Pfizer COVID-19 Vaccine 04/02/2020 11:01 AM 0.3 mL 12/05/2019 Intramuscular   Manufacturer: [employer], [employer]   Lot: [id number]   NDC: [address]-3

## 2020-04-15 DIAGNOSIS — H547 Unspecified visual loss: Secondary | ICD-10-CM | POA: Insufficient documentation

## 2020-04-15 DIAGNOSIS — H8111 Benign paroxysmal vertigo, right ear: Secondary | ICD-10-CM

## 2020-04-15 DIAGNOSIS — H9190 Unspecified hearing loss, unspecified ear: Secondary | ICD-10-CM

## 2020-04-15 HISTORY — DX: Unspecified hearing loss, unspecified ear: H91.90

## 2020-04-15 HISTORY — DX: Benign paroxysmal vertigo, right ear: H81.11

## 2020-04-21 ENCOUNTER — Other Ambulatory Visit: Payer: Self-pay | Admitting: Otolaryngology

## 2020-04-21 DIAGNOSIS — H9191 Unspecified hearing loss, right ear: Secondary | ICD-10-CM

## 2020-04-26 ENCOUNTER — Ambulatory Visit: Payer: Medicare Other | Attending: Internal Medicine

## 2020-04-26 DIAGNOSIS — Z23 Encounter for immunization: Secondary | ICD-10-CM

## 2020-04-26 NOTE — Progress Notes (Signed)
   Covid-19 Vaccination Clinic  Name:  Jillian Taylor    MRN: [id number] DOB: 1951/09/19  04/26/2020  [name] was observed post Covid-19 immunization for 15 minutes without incident. She was provided with Vaccine Information Sheet and instruction to access the V-Safe system.   [name] was instructed to call 911 with any severe reactions post vaccine: Marland Kitchen Difficulty breathing  . Swelling of face and throat  . A fast heartbeat  . A bad rash all over body  . Dizziness and weakness   Immunizations Administered    Name Date Dose VIS Date Route   Pfizer COVID-19 Vaccine 04/26/2020 11:13 AM 0.3 mL 02/18/2019 Intramuscular   Manufacturer: [employer], [employer]   Lot: [id number]   NDC: [address]-9

## 2020-04-29 DIAGNOSIS — E785 Hyperlipidemia, unspecified: Secondary | ICD-10-CM | POA: Diagnosis not present

## 2020-04-29 DIAGNOSIS — Z0000 Encounter for general adult medical examination without abnormal findings: Secondary | ICD-10-CM | POA: Diagnosis not present

## 2020-04-29 DIAGNOSIS — Z9181 History of falling: Secondary | ICD-10-CM | POA: Diagnosis not present

## 2020-04-30 ENCOUNTER — Ambulatory Visit: Payer: Medicare Other | Attending: Otolaryngology | Admitting: Physical Therapy

## 2020-04-30 ENCOUNTER — Other Ambulatory Visit: Payer: Self-pay

## 2020-04-30 DIAGNOSIS — R2681 Unsteadiness on feet: Secondary | ICD-10-CM | POA: Diagnosis not present

## 2020-04-30 DIAGNOSIS — R42 Dizziness and giddiness: Secondary | ICD-10-CM | POA: Insufficient documentation

## 2020-04-30 DIAGNOSIS — H8113 Benign paroxysmal vertigo, bilateral: Secondary | ICD-10-CM | POA: Diagnosis not present

## 2020-04-30 DIAGNOSIS — R262 Difficulty in walking, not elsewhere classified: Secondary | ICD-10-CM | POA: Diagnosis not present

## 2020-04-30 NOTE — Therapy (Addendum)
[facility] [address] [place], [place], [address] Phone: [phone]   Fax:  [phone]  Physical Therapy Evaluation  Patient Details  Name: Jillian Taylor MRN: [id number] Date of Birth: 02-28-1951 Referring Provider (PT): Serena Colonel, MD   Encounter Date: 04/30/2020  PT End of Session - 04/30/20 1022    Visit Number  1    Number of Visits  5    Date for PT Re-Evaluation  05/30/20    Authorization Type  [employer] - Medicare; covered 100% as long as enrolled in Medicaid - no copay    PT Start Time  0934    PT Stop Time  1015    PT Time Calculation (min)  41 min    Activity Tolerance  Patient tolerated treatment well    Behavior During Therapy  [facility] for tasks assessed/performed       Past Medical History:  Diagnosis Date  . Arthritis   . Asthma   . Blind   . Eczema     Past Surgical History:  Procedure Laterality Date  . CHOLECYSTECTOMY    . EYE SURGERY    . LUNG BIOPSY    . TONSILLECTOMY      There were no vitals filed for this visit.   Subjective Assessment - 04/30/20 0941    Subjective  Pt reports dizziness is not bad today.  When it first started it presented as spinning - started in January.  2001-2003 was the first time it ever occured.  If I stay off my right side, it doesn't bother me as much.  If I turn or roll to the R everything starts spinning.    Patient is accompained by:  Family member    Pertinent History  OA, asthma, blindness, cholecystectomy, eye surgery    Patient Stated Goals  To get rid of the dizziness and return to walking    Currently in Pain?  No/denies         [facility] PT Assessment - 04/30/20 0946      Assessment   Medical Diagnosis  Vertigo    Referring Provider (PT)  Serena Colonel, MD    Onset Date/Surgical Date  04/21/20    Prior Therapy  unknown      Precautions   Precautions  Other (comment);Fall    Precaution Comments  OA, asthma, blindness, cholecystectomy, eye  surgery, falls      Balance Screen   Has the patient fallen in the past 6 months  Yes    How many times?  3   due to tripping over feet; not due to dizziness   Has the patient had a decrease in activity level because of a fear of falling?   Yes      Home Environment   Living Environment  Private residence    Living Arrangements  Alone    Type of Home  [facility] Equipment  [place] - single point      Prior Function   Level of Independence  Independent with household mobility with device;Independent with community mobility without device;Independent with transfers;Requires assistive device for independence;Needs assistance with gait    Leisure  walking      Observation/Other Assessments   Focus on Therapeutic Outcomes (FOTO)   48% function    Other Surveys   Dizziness Handicap Inventory [facility])    Dizziness Handicap Inventory (DHI)   36%      Ambulation/Gait   Gait Comments  Requires cane  and HHA due to visual impairment           Vestibular Assessment - 04/30/20 0949      Symptom Behavior   Type of Dizziness   Spinning    Frequency of Dizziness  intermittently     Duration of Dizziness  minutes    Symptom Nature  Positional    Aggravating Factors  Turning head quickly;Turning body quickly;Rolling to right    Relieving Factors  Comments   not going to R side   Progression of Symptoms  Better      Oculomotor Exam   Oculomotor Alignment  Abnormal    Spontaneous  Comment   R beating, changed to L beating after treatment   Gaze-induced   Right beating nystagmus with R gaze    Comment  not able to test oculomotor due to blindness      Vestibulo-Ocular Reflex   Comment  not able to test due to blindness      Positional Testing   Dix-Hallpike  Dix-Hallpike Right;Dix-Hallpike Left    Horizontal Canal Testing  Horizontal Canal Right;Horizontal Canal Left      Dix-Hallpike Right   Dix-Hallpike Right Duration  5 seconds    Dix-Hallpike Right Symptoms  Other (comment)    combination of movements     Dix-Hallpike Left   Dix-Hallpike Left Duration  5 seconds less severe    Dix-Hallpike Left Symptoms  Other (comment)   mild L rotary     Horizontal Canal Right   Horizontal Canal Right Duration  5 seconds   2nd assessment clear   Horizontal Canal Right Symptoms  Geotrophic      Horizontal Canal Left   Horizontal Canal Left Duration  0    Horizontal Canal Left Symptoms  Normal          Objective measurements completed on examination: See above findings.       Vestibular Treatment/Exercise - 04/30/20 1005      Vestibular Treatment/Exercise   Vestibular Treatment Provided  Canalith Repositioning    Canalith Repositioning  Canal Roll Right      Canal Roll Right   Number of Reps   1    Overall Response   Symptoms Resolved    Response Details   no nausea            PT Education - 04/30/20 1022    Education Details  clinical findings, PT POC and goals, possible bilateral BPPV    Person(s) Educated  Patient    Methods  Explanation    Comprehension  Verbalized understanding          PT Long Term Goals - 04/30/20 1026      PT LONG TERM GOAL #1   Title  Pt will demonstrate negative positional testing bilaterally for all canals    Baseline  R horizontal BPPV    Time  4    Period  Weeks    Status  New    Target Date  05/30/20      PT LONG TERM GOAL #2   Title  Pt will report being able to perform rolling to L and R and sit <> supine without any symptoms of dizziness    Baseline  rolling to R    Time  4    Period  Weeks    Status  New    Target Date  05/30/20      PT LONG TERM GOAL #3   Title  Pt will return  to walking outside with supervision for exercise due to resolution of dizziness    Baseline  not walking right now due to dizziness    Time  4    Period  Weeks    Status  New    Target Date  05/30/20      PT LONG TERM GOAL #4   Title  Pt will improve overall FOTO score to >/= 60% and DHI will decrease by 10 points     Baseline  FOTO: 48%, DHI: 36    Time  4    Period  Weeks    Status  New    Target Date  05/30/20             Plan - 04/30/20 1023    Clinical Impression Statement  Pt is a 69 year old female referred to Neuro OPPT for evaluation of vertigo.  Pt's PMH is significant for the following: OA, asthma, blindness, cholecystectomy, eye surgery. The following deficits were noted during pt's exam: spontaneous nystagmus, nystagmus and subjective vertigo with positional testing to R and L indicating possible bilat BPPV but difficult to determine due to varying directions of nystagmus so laterality and canal determined by symptoms - treated R horizontal canal BPPV with BBQ roll with resolution of symptoms, impaired balance and difficulty walking.  Pt would benefit from skilled PT to address these impairments and functional limitations to maximize functional mobility independence and reduce falls risk.    Personal Factors and Comorbidities  Comorbidity 3+;Social Background;Transportation    Comorbidities  falls, OA, asthma, blindness, cholecystectomy, eye surgery    Examination-Activity Limitations  Bed Mobility;Locomotion Level;Transfers    Examination-Participation Restrictions  Community Activity    Stability/Clinical Decision Making  Stable/Uncomplicated    Clinical Decision Making  Low    Rehab Potential  Good    PT Frequency  1x / week    PT Duration  4 weeks    PT Treatment/Interventions  ADLs/Self Care Home Management;Canalith Repostioning;Gait training;Stair training;Functional mobility training;Therapeutic activities;Therapeutic exercise;Balance training;Neuromuscular re-education;Patient/family education;Vestibular    PT Next Visit Plan  reassess R horizontal canal BPPV, check for L posterior canal BPPV and treat as indicated.  Teach home Epley or Li Roll maneuver    Consulted and Agree with Plan of Care  Patient       Patient will benefit from skilled therapeutic intervention in order  to improve the following deficits and impairments:  Decreased balance, Dizziness, Difficulty walking  Visit Diagnosis: BPPV (benign paroxysmal positional vertigo), bilateral - Plan: PT plan of care cert/re-cert,  Dizziness and giddiness - Plan: PT plan of care cert/re-cert,   Unsteadiness on feet - Plan: PT plan of care cert/re-cert,  Difficulty in walking, not elsewhere classified - Plan: PT plan of care cert/re-cert     Problem List Patient Active Problem List   Diagnosis Date Noted  . Essential hypertension 03/24/2019  . Overweight 03/24/2019  . Chest discomfort 03/24/2019  . Mixed dyslipidemia 03/24/2019  . Allergic rhinoconjunctivitis 11/08/2015  . Aseptic necrosis of head or neck of femur 12/08/2011  . Morbid obesity (HCC) 12/08/2011  . ANXIETY DEPRESSION 07/02/2007  . UVEITIS 07/02/2007  . Mild intermittent asthma 07/02/2007  . PULMONARY NODULE 07/02/2007  . CONSTIPATION, RECURRENT 07/02/2007  . MENOPAUSAL SYNDROME 07/02/2007  . ALOPECIA AREATA 07/02/2007  . BACK PAIN, LUMBAR, CHRONIC 07/02/2007  . LPRD (laryngopharyngeal reflux disease) 07/02/2007  . TOBACCO USE, QUIT 07/02/2007    Dierdre Highman, PT, DPT 04/30/20    12:15 PM  [facility] [address] [place], [place], [address] Phone: [phone]   Fax:  [phone]  Name: Jillian Taylor MRN: [id number] Date of Birth: 1951-05-09

## 2020-04-30 NOTE — Addendum Note (Signed)
Addended by: Bufford Lope F on: 04/30/2020 12:16 PM   Modules accepted: Orders

## 2020-05-04 ENCOUNTER — Encounter: Payer: Self-pay | Admitting: Physical Therapy

## 2020-05-04 NOTE — Therapy (Signed)
[facility] [address] [place], [place], [address] Phone: [phone]   Fax:  [phone]  Patient Details  Name: NYX KEADY MRN: [id number] Date of Birth: Sep 17, 1951 Referring Provider:  No ref. provider found  Encounter Date: 05/04/2020  PHYSICAL THERAPY DISCHARGE SUMMARY  Visits from Start of Care: 1 evaluation only  Current functional level related to goals / functional outcomes: Pt has elected to continue her vestibular rehab at another clinic closer to her home.  This clinic has treated the patient's vertigo in the past.  Pt to be discharged from OP Neuro clinic.   Remaining deficits: Positional vertigo and imbalance   Education / Equipment: Education on BPPV  Plan: Patient agrees to discharge.  Patient goals were not met. Patient is being discharged due to the patient's request.  ?????     Rico Junker, PT, DPT 05/04/20    1:46 PM [facility] [address] [place], [place], [address] Phone: [phone]   Fax:  [phone]

## 2020-05-05 ENCOUNTER — Ambulatory Visit: Payer: Medicare Other | Admitting: Physical Therapy

## 2020-05-17 ENCOUNTER — Other Ambulatory Visit: Payer: Self-pay

## 2020-05-17 ENCOUNTER — Ambulatory Visit
Admission: RE | Admit: 2020-05-17 | Discharge: 2020-05-17 | Disposition: A | Discharge status: Home / Self Care | Payer: Medicare Other | Source: Ambulatory Visit | Attending: Otolaryngology | Admitting: Otolaryngology

## 2020-05-17 DIAGNOSIS — H9191 Unspecified hearing loss, right ear: Secondary | ICD-10-CM

## 2020-05-17 MED ORDER — GADOBENATE DIMEGLUMINE 529 MG/ML IV SOLN
20.0000 mL | Freq: Once | INTRAVENOUS | Status: AC | PRN
  Administered 2020-05-17: 20 mL via INTRAVENOUS

## 2020-05-18 DIAGNOSIS — J45909 Unspecified asthma, uncomplicated: Secondary | ICD-10-CM | POA: Diagnosis not present

## 2020-05-20 DIAGNOSIS — J45909 Unspecified asthma, uncomplicated: Secondary | ICD-10-CM | POA: Diagnosis not present

## 2020-05-25 ENCOUNTER — Encounter: Payer: Self-pay | Admitting: Physical Therapy

## 2020-05-28 DIAGNOSIS — H905 Unspecified sensorineural hearing loss: Secondary | ICD-10-CM | POA: Diagnosis not present

## 2020-06-01 DIAGNOSIS — R633 Feeding difficulties: Secondary | ICD-10-CM | POA: Diagnosis not present

## 2020-06-01 DIAGNOSIS — E559 Vitamin D deficiency, unspecified: Secondary | ICD-10-CM | POA: Diagnosis not present

## 2020-06-01 DIAGNOSIS — R42 Dizziness and giddiness: Secondary | ICD-10-CM | POA: Diagnosis not present

## 2020-06-01 DIAGNOSIS — H547 Unspecified visual loss: Secondary | ICD-10-CM | POA: Diagnosis not present

## 2020-06-01 DIAGNOSIS — E538 Deficiency of other specified B group vitamins: Secondary | ICD-10-CM | POA: Diagnosis not present

## 2020-06-01 DIAGNOSIS — H8112 Benign paroxysmal vertigo, left ear: Secondary | ICD-10-CM | POA: Diagnosis not present

## 2020-06-10 DIAGNOSIS — M25559 Pain in unspecified hip: Secondary | ICD-10-CM | POA: Diagnosis not present

## 2020-06-18 DIAGNOSIS — J45909 Unspecified asthma, uncomplicated: Secondary | ICD-10-CM | POA: Diagnosis not present

## 2020-07-01 DIAGNOSIS — H547 Unspecified visual loss: Secondary | ICD-10-CM | POA: Diagnosis not present

## 2020-07-01 DIAGNOSIS — R633 Feeding difficulties: Secondary | ICD-10-CM | POA: Diagnosis not present

## 2020-07-01 DIAGNOSIS — E538 Deficiency of other specified B group vitamins: Secondary | ICD-10-CM | POA: Diagnosis not present

## 2020-07-01 DIAGNOSIS — E559 Vitamin D deficiency, unspecified: Secondary | ICD-10-CM | POA: Diagnosis not present

## 2020-07-05 DIAGNOSIS — J45909 Unspecified asthma, uncomplicated: Secondary | ICD-10-CM | POA: Diagnosis not present

## 2020-07-18 DIAGNOSIS — J45909 Unspecified asthma, uncomplicated: Secondary | ICD-10-CM | POA: Diagnosis not present

## 2020-07-30 ENCOUNTER — Other Ambulatory Visit: Payer: Self-pay

## 2020-07-30 ENCOUNTER — Encounter (HOSPITAL_COMMUNITY): Payer: Self-pay | Admitting: *Deleted

## 2020-07-30 ENCOUNTER — Emergency Department (HOSPITAL_COMMUNITY): Payer: Medicare Other

## 2020-07-30 ENCOUNTER — Emergency Department (HOSPITAL_COMMUNITY)
Admission: EM | Admit: 2020-07-30 | Discharge: 2020-07-30 | Disposition: A | Discharge status: Home / Self Care | Payer: Medicare Other | Attending: Emergency Medicine | Admitting: Emergency Medicine

## 2020-07-30 DIAGNOSIS — J45909 Unspecified asthma, uncomplicated: Secondary | ICD-10-CM | POA: Insufficient documentation

## 2020-07-30 DIAGNOSIS — M4316 Spondylolisthesis, lumbar region: Secondary | ICD-10-CM | POA: Diagnosis not present

## 2020-07-30 DIAGNOSIS — R110 Nausea: Secondary | ICD-10-CM | POA: Insufficient documentation

## 2020-07-30 DIAGNOSIS — I700 Atherosclerosis of aorta: Secondary | ICD-10-CM | POA: Diagnosis not present

## 2020-07-30 DIAGNOSIS — Z87891 Personal history of nicotine dependence: Secondary | ICD-10-CM | POA: Insufficient documentation

## 2020-07-30 DIAGNOSIS — R109 Unspecified abdominal pain: Secondary | ICD-10-CM

## 2020-07-30 DIAGNOSIS — Z79899 Other long term (current) drug therapy: Secondary | ICD-10-CM | POA: Diagnosis not present

## 2020-07-30 DIAGNOSIS — I10 Essential (primary) hypertension: Secondary | ICD-10-CM | POA: Insufficient documentation

## 2020-07-30 DIAGNOSIS — Z9049 Acquired absence of other specified parts of digestive tract: Secondary | ICD-10-CM | POA: Diagnosis not present

## 2020-07-30 DIAGNOSIS — R1031 Right lower quadrant pain: Secondary | ICD-10-CM | POA: Diagnosis not present

## 2020-07-30 HISTORY — DX: Essential (primary) hypertension: I10

## 2020-07-30 LAB — COMPREHENSIVE METABOLIC PANEL
ALT: 18 U/L (ref 0–44)
AST: 26 U/L (ref 15–41)
Albumin: 4.1 g/dL (ref 3.5–5.0)
Alkaline Phosphatase: 84 U/L (ref 38–126)
Anion gap: 10 (ref 5–15)
BUN: 13 mg/dL (ref 8–23)
CO2: 27 mmol/L (ref 22–32)
Calcium: 9.6 mg/dL (ref 8.9–10.3)
Chloride: 101 mmol/L (ref 98–111)
Creatinine, Ser: 0.76 mg/dL (ref 0.44–1.00)
GFR calc Af Amer: >60 mL/min (ref >60)
GFR calc non Af Amer: >60 mL/min (ref >60)
Glucose, Bld: 96 mg/dL (ref 70–99)
Potassium: 4.5 mmol/L (ref 3.5–5.1)
Sodium: 138 mmol/L (ref 135–145)
Total Bilirubin: 0.9 mg/dL (ref 0.3–1.2)
Total Protein: 8.2 g/dL — ABNORMAL HIGH (ref 6.5–8.1)

## 2020-07-30 LAB — URINALYSIS, ROUTINE W REFLEX MICROSCOPIC
Bilirubin Urine: NEGATIVE
Glucose, UA: NEGATIVE mg/dL
Hgb urine dipstick: NEGATIVE
Ketones, ur: NEGATIVE mg/dL
Leukocytes,Ua: NEGATIVE
Nitrite: NEGATIVE
Protein, ur: NEGATIVE mg/dL
Specific Gravity, Urine: 1.003 — ABNORMAL LOW (ref 1.005–1.030)
pH: 6 (ref 5.0–8.0)

## 2020-07-30 LAB — CBC
HCT: 40.1 % (ref 36.0–46.0)
Hemoglobin: 12.1 g/dL (ref 12.0–15.0)
MCH: 21.6 pg — ABNORMAL LOW (ref 26.0–34.0)
MCHC: 30.2 g/dL (ref 30.0–36.0)
MCV: 71.6 fL — ABNORMAL LOW (ref 80.0–100.0)
Platelets: 218 10*3/uL (ref 150–400)
RBC: 5.6 MIL/uL — ABNORMAL HIGH (ref 3.87–5.11)
RDW: 15.2 % (ref 11.5–15.5)
WBC: 9.7 10*3/uL (ref 4.0–10.5)
nRBC: 0 % (ref 0.0–0.2)

## 2020-07-30 LAB — LIPASE, BLOOD: Lipase: 31 U/L (ref 11–51)

## 2020-07-30 MED ORDER — IOHEXOL 300 MG/ML  SOLN
100.0000 mL | Freq: Once | INTRAMUSCULAR | Status: AC | PRN
  Administered 2020-07-30: 100 mL via INTRAVENOUS

## 2020-07-30 MED ORDER — SODIUM CHLORIDE 0.9% FLUSH: 3.0000 mL | Freq: Once | INTRAVENOUS | Status: DC

## 2020-07-30 MED ORDER — MORPHINE SULFATE (PF) 2 MG/ML IV SOLN
2.0000 mg | Freq: Once | INTRAVENOUS | Status: AC
  Administered 2020-07-30: 2 mg via INTRAVENOUS
  Filled 2020-07-30: qty 1

## 2020-07-30 MED ORDER — SODIUM CHLORIDE (PF) 0.9 % IJ SOLN
INTRAMUSCULAR | Status: AC
  Filled 2020-07-30: qty 50

## 2020-07-30 MED ORDER — ACETAMINOPHEN 500 MG PO TABS: 1000.0000 mg | ORAL_TABLET | Freq: Once | ORAL | Status: DC

## 2020-07-30 MED ORDER — ONDANSETRON HCL 4 MG/2ML IJ SOLN
4.0000 mg | Freq: Once | INTRAMUSCULAR | Status: AC
  Administered 2020-07-30: 4 mg via INTRAVENOUS
  Filled 2020-07-30: qty 2

## 2020-07-30 NOTE — ED Notes (Signed)
Assumed care of patient at this time, nad noted, sr up x2, bed locked and low, call bell w/I reach.  Will continue to monitor. ° °

## 2020-07-30 NOTE — Discharge Instructions (Signed)
1. Medications: Take 1 to 2 tablets of extra Tylenol every 6 hours as needed for pain.  Do not exceed more than 4000 mg of Tylenol daily. 2. Treatment: rest, drink plenty of fluids, gentle stretching as discussed (see attached), alternate ice and heat (or stick with whichever feels best) 20 minutes on 20 minutes off. 3. Follow Up: Please followup with your primary doctor in 3-7 days for discussion of your diagnoses and further evaluation after today's visit; if you do not have a primary care doctor use the resource guide provided to find one;  Return to the ER for worsening back pain, difficulty walking, loss of bowel or bladder control or other concerning symptoms

## 2020-07-30 NOTE — ED Provider Notes (Signed)
[facility]-EMERGENCY DEPT Provider Note   CSN: [id number] Arrival date & time: 07/30/20  1222     History Chief Complaint  Patient presents with  . Abdominal Pain  . Flank Pain    Jillian Taylor is a 69 y.o. female with history of blindness, hypertension, asthma, arthritis, hyperlipidemia presents for evaluation of acute onset, persistent and progressively worsening right-sided abdominal pains for 3 days.  Reports that 1.5 weeks ago she picked up a watermelon and thought that she might start developing back pain shortly thereafter.  She states that she did not have any back pain for several days.  3 days ago after cooking for her children and hosting them in her home she laid down and upon awakening she noticed some pain to her right flank "urine for my kidneys are".  States that the pain is burning, radiates to the right side of the abdomen and into the right groin.  When the pain intensifies she will feel nauseated but has had no vomiting.  She denies diarrhea, constipation, urinary symptoms, fevers, chills, chest pain or shortness of breath.  Took some Tylenol for her symptoms with little relief and is also applied heating pads with some relief.  Denies pain radiating to her lower extremities or weakness of the lower extremities.  She is accompanied today by her home health aide.  The history is provided by the patient.       Past Medical History:  Diagnosis Date  . Arthritis   . Asthma   . Blind   . Eczema   . Hypertension     Patient Active Problem List   Diagnosis Date Noted  . Essential hypertension 03/24/2019  . Overweight 03/24/2019  . Chest discomfort 03/24/2019  . Mixed dyslipidemia 03/24/2019  . Allergic rhinoconjunctivitis 11/08/2015  . Aseptic necrosis of head or neck of femur 12/08/2011  . Morbid obesity (HCC) 12/08/2011  . ANXIETY DEPRESSION 07/02/2007  . UVEITIS 07/02/2007  . Mild intermittent asthma 07/02/2007  . PULMONARY NODULE  07/02/2007  . CONSTIPATION, RECURRENT 07/02/2007  . MENOPAUSAL SYNDROME 07/02/2007  . ALOPECIA AREATA 07/02/2007  . BACK PAIN, LUMBAR, CHRONIC 07/02/2007  . LPRD (laryngopharyngeal reflux disease) 07/02/2007  . TOBACCO USE, QUIT 07/02/2007    Past Surgical History:  Procedure Laterality Date  . CHOLECYSTECTOMY    . EYE SURGERY    . LUNG BIOPSY    . TONSILLECTOMY       OB History   No obstetric history on file.     Family History  Problem Relation Age of Onset  . Asthma Sister   . Allergic rhinitis Sister   . Asthma Brother   . Allergic Disorder Brother   . Asthma Sister     Social History   Tobacco Use  . Smoking status: Former Games developer  . Smokeless tobacco: Never Used  Substance Use Topics  . Alcohol use: Yes    Alcohol/week: 2.0 standard drinks    Types: 1 Glasses of wine, 1 Cans of beer per week  . Drug use: No    Home Medications Prior to Admission medications   Medication Sig Start Date End Date Taking? Authorizing Provider  albuterol (PROAIR HFA) 108 (90 BASE) MCG/ACT inhaler Inhale 2 puffs into the lungs every 4 (four) hours as needed for wheezing or shortness of breath.   Yes [provider]  amLODipine (NORVASC) 2.5 MG tablet TAKE 1 TABLET BY MOUTH EVERY DAY 03/30/20  Yes Revankar, Aundra Dubin, MD  cetirizine HCl (ZYRTEC)  1 MG/ML solution Take 10 mg by mouth daily as needed (allergies).  06/12/20  Yes [provider]  Cholecalciferol (VITAMIN D PO) Take 1 capsule by mouth daily.   Yes [provider]  Cyanocobalamin (B-12 PO) Take 1 tablet by mouth daily.   Yes [provider]  diazepam (VALIUM) 2 MG tablet Take 2 mg by mouth every 12 (twelve) hours as needed for anxiety.  08/29/16  Yes [provider]  hydrochlorothiazide (HYDRODIURIL) 12.5 MG tablet Take 12.5 mg by mouth every morning. 07/10/20  Yes [provider]  HYDROcodone-acetaminophen (NORCO/VICODIN) 5-325 MG tablet Take 1 tablet by mouth 2 (two) times  daily as needed for moderate pain or severe pain.  11/12/19  Yes [provider]  ipratropium-albuterol (DUONEB) 0.5-2.5 (3) MG/3ML SOLN Take 3 mLs by nebulization every 4 (four) hours as needed. Patient taking differently: Take 3 mLs by nebulization every 6 (six) hours as needed (sob/wheezing).  11/17/15  Yes Kozlow, Alvira PhilipsEric J, MD  montelukast (SINGULAIR) 10 MG tablet Take 10 mg by mouth daily. 02/16/20  Yes [provider]  nitroGLYCERIN (NITROSTAT) 0.4 MG SL tablet Place 0.4 mg under the tongue every 5 (five) minutes as needed for chest pain.  02/22/20  Yes [provider]  PRED FORTE 1 % ophthalmic suspension 1 drop in the left eye 4 times daily and 1 drop in the right eye 3 times  07/17/16  Yes [provider]    Allergies    Methotrexate derivatives, Penicillins, and Ciprofloxacin  Review of Systems   Review of Systems  Constitutional: Negative for chills and fever.  Respiratory: Negative for shortness of breath.   Cardiovascular: Negative for chest pain.  Gastrointestinal: Positive for abdominal pain and nausea. Negative for constipation, diarrhea and vomiting.  Genitourinary: Positive for flank pain. Negative for dysuria, frequency and urgency.  All other systems reviewed and are negative.   Physical Exam Updated Vital Signs BP (!) 161/84 (BP Location: Left Arm)   Pulse 66   Temp 97.7 F (36.5 C) (Oral)   Resp 16   Ht 5\' 6"  (1.676 m)   Wt 108.9 kg   SpO2 100%   BMI 38.74 kg/m   Physical Exam Vitals and nursing note reviewed.  Constitutional:      General: She is not in acute distress.    Appearance: She is well-developed.  HENT:     Head: Normocephalic and atraumatic.  Eyes:     General:        Right eye: No discharge.        Left eye: No discharge.     Conjunctiva/sclera: Conjunctivae normal.  Neck:     Vascular: No JVD.     Trachea: No tracheal deviation.  Cardiovascular:     Rate and Rhythm: Normal rate and regular rhythm.    Pulmonary:     Effort: Pulmonary effort is normal.     Breath sounds: Normal breath sounds.  Abdominal:     General: There is no distension.     Palpations: Abdomen is soft.     Tenderness: There is no abdominal tenderness. There is no right CVA tenderness, left CVA tenderness, guarding or rebound. Negative signs include Murphy's sign and Rovsing's sign.  Musculoskeletal:     Comments: No midline lumbar spine tenderness, no deformity, crepitus, or step-off.  Skin:    General: Skin is warm and dry.     Findings: No erythema.  Neurological:     Mental Status: She is alert.  Psychiatric:        Behavior: Behavior normal.     ED Results / Procedures / Treatments   Labs (all labs ordered are listed, but only abnormal results are displayed) Labs Reviewed  COMPREHENSIVE METABOLIC PANEL - Abnormal; Notable for the following components:      Result Value   Total Protein 8.2 (*)    All other components within normal limits  CBC - Abnormal; Notable for the following components:   RBC 5.60 (*)    MCV 71.6 (*)    MCH 21.6 (*)    All other components within normal limits  URINALYSIS, ROUTINE W REFLEX MICROSCOPIC - Abnormal; Notable for the following components:   Specific Gravity, Urine 1.003 (*)    All other components within normal limits  LIPASE, BLOOD    EKG None  Radiology CT ABDOMEN PELVIS W CONTRAST  Result Date: 07/30/2020 CLINICAL DATA:  Right lower quadrant pain EXAM: CT ABDOMEN AND PELVIS WITH CONTRAST TECHNIQUE: Multidetector CT imaging of the abdomen and pelvis was performed using the standard protocol following bolus administration of intravenous contrast. CONTRAST:  OMNIPAQUE IOHEXOL 300 MG/ML  SOLN COMPARISON:  09/04/2017 FINDINGS: Lower chest: Lung bases demonstrate stable bilateral lung base nodules some of which are calcified and likely granulomas. No acute airspace disease or effusion. Mild cardiomegaly. Hepatobiliary: No focal liver abnormality is seen.  Status post cholecystectomy. No biliary dilatation. Pancreas: Unremarkable. No pancreatic ductal dilatation or surrounding inflammatory changes. Spleen: Normal in size without focal abnormality. Adrenals/Urinary Tract: Adrenal glands are normal. Prominent right extrarenal pelvis without hydroureter. Left kidney within normal limits. The urinary bladder is unremarkable. Stomach/Bowel: Stomach is within normal limits. Appendix not well seen but no right lower quadrant inflammatory process. No evidence of bowel wall thickening, distention, or inflammatory changes. Vascular/Lymphatic: Mild aortic atherosclerosis. No aneurysm. No suspicious nodes Reproductive: Uterus and bilateral adnexa are unremarkable. Other: Negative for free air or free fluid. Small fat within the umbilical region. Musculoskeletal: Grade 1 anterolisthesis L4 on L5. No acute or suspicious osseous abnormality. IMPRESSION: No CT evidence for acute intra-abdominal or pelvic abnormality. Aortic Atherosclerosis (ICD10-I70.0). Electronically Signed   By: Jasmine Pang M.D.   On: 07/30/2020 19:18    Procedures Procedures (including critical care time)  Medications Ordered in ED Medications  sodium chloride flush (NS) 0.9 % injection 3 mL ( Intravenous Not Given 07/30/20 1837)  acetaminophen (TYLENOL) tablet 1,000 mg (has no administration in time range)  morphine 2 MG/ML injection 2 mg (2 mg Intravenous Given 07/30/20 1837)  ondansetron (ZOFRAN) injection 4 mg (4 mg Intravenous Given 07/30/20 1836)  iohexol (OMNIPAQUE) 300 MG/ML solution 100 mL (100 mLs Intravenous Contrast Given 07/30/20 1814)    ED Course  I have reviewed the triage vital signs and the nursing notes.  Pertinent labs & imaging results that were available during my care of the patient were reviewed by me and considered in my medical decision making (see chart for details).    MDM Rules/Calculators/A&P                          Patient presenting for evaluation of right-sided  abdominal/flank pain beginning a few days ago.  She seems to think it is related to picking up a heavy watermelon a week and a half ago and also being on her feet hosting her children just prior to symptom onset.  She is afebrile, intermittently hypertensive, vital signs otherwise stable.  She is generally well-appearing.  No rebound or guarding noted on examination of the abdomen.  No midline spine tenderness or red flag signs concerning for cauda equina or spinal abscess.  Lab work reviewed and interpreted by myself shows no leukocytosis, no anemia, no metabolic derangements, no renal insufficiency.  Her UA is not concerning for UTI or nephrolithiasis.  Imaging obtained shows no evidence of obstruction, perforation, parasites, cholecystitis, diverticulitis, nephrolithiasis, TOA, or other concerning abdominal pelvic pathology.  Pain managed in the ED.  On reevaluation she is resting comfortably in no apparent distress.  Serial abdominal examinations remain benign.  Suspect that her pain is likely musculoskeletal in etiology.  Recommend Tylenol, heat therapy, gentle stretching, and close follow-up with PCP for reevaluation of symptoms.  Discuss strict ED return precautions.  Patient and aide at the bedside verbalized understanding of and agreement with plan and patient is stable for discharge at this time.   Final Clinical Impression(s) / ED Diagnoses Final diagnoses:  Acute right flank pain    Rx / DC Orders ED Discharge Orders    None       Bennye Alm 07/30/20 2009    Terald Sleeper, MD 07/31/20 [phone]

## 2020-07-30 NOTE — ED Notes (Signed)
Patient is resting comfortably. 

## 2020-07-30 NOTE — ED Notes (Signed)
Patient home with caregiver.

## 2020-07-30 NOTE — ED Triage Notes (Signed)
Rt flank and abd pain for about a week. Concerned it may be appendix.

## 2020-08-05 DIAGNOSIS — L659 Nonscarring hair loss, unspecified: Secondary | ICD-10-CM | POA: Diagnosis not present

## 2020-08-05 DIAGNOSIS — M545 Low back pain: Secondary | ICD-10-CM | POA: Diagnosis not present

## 2020-08-05 DIAGNOSIS — Z139 Encounter for screening, unspecified: Secondary | ICD-10-CM | POA: Diagnosis not present

## 2020-08-05 DIAGNOSIS — R1031 Right lower quadrant pain: Secondary | ICD-10-CM | POA: Diagnosis not present

## 2020-08-18 DIAGNOSIS — J45909 Unspecified asthma, uncomplicated: Secondary | ICD-10-CM | POA: Diagnosis not present

## 2020-09-06 DIAGNOSIS — J45909 Unspecified asthma, uncomplicated: Secondary | ICD-10-CM | POA: Diagnosis not present

## 2020-09-18 DIAGNOSIS — J45909 Unspecified asthma, uncomplicated: Secondary | ICD-10-CM | POA: Diagnosis not present

## 2020-09-20 DIAGNOSIS — J45909 Unspecified asthma, uncomplicated: Secondary | ICD-10-CM | POA: Diagnosis not present

## 2020-10-13 DIAGNOSIS — I10 Essential (primary) hypertension: Secondary | ICD-10-CM | POA: Diagnosis not present

## 2020-10-13 DIAGNOSIS — J45909 Unspecified asthma, uncomplicated: Secondary | ICD-10-CM | POA: Diagnosis not present

## 2020-10-13 DIAGNOSIS — L659 Nonscarring hair loss, unspecified: Secondary | ICD-10-CM | POA: Diagnosis not present

## 2020-10-13 DIAGNOSIS — E785 Hyperlipidemia, unspecified: Secondary | ICD-10-CM | POA: Diagnosis not present

## 2020-10-18 DIAGNOSIS — J45909 Unspecified asthma, uncomplicated: Secondary | ICD-10-CM | POA: Diagnosis not present

## 2020-10-27 DIAGNOSIS — M1990 Unspecified osteoarthritis, unspecified site: Secondary | ICD-10-CM | POA: Insufficient documentation

## 2020-10-27 DIAGNOSIS — I10 Essential (primary) hypertension: Secondary | ICD-10-CM | POA: Insufficient documentation

## 2020-10-27 DIAGNOSIS — J45909 Unspecified asthma, uncomplicated: Secondary | ICD-10-CM | POA: Insufficient documentation

## 2020-10-27 DIAGNOSIS — L309 Dermatitis, unspecified: Secondary | ICD-10-CM | POA: Insufficient documentation

## 2020-10-28 ENCOUNTER — Ambulatory Visit (INDEPENDENT_AMBULATORY_CARE_PROVIDER_SITE_OTHER): Payer: Medicare Other | Admitting: Cardiology

## 2020-10-28 ENCOUNTER — Other Ambulatory Visit: Payer: Self-pay

## 2020-10-28 ENCOUNTER — Encounter: Payer: Self-pay | Admitting: Cardiology

## 2020-10-28 VITALS — BP 144/84 | HR 69 | Ht 66.0 in | Wt 240.0 lb

## 2020-10-28 DIAGNOSIS — I10 Essential (primary) hypertension: Secondary | ICD-10-CM | POA: Diagnosis not present

## 2020-10-28 DIAGNOSIS — E669 Obesity, unspecified: Secondary | ICD-10-CM | POA: Insufficient documentation

## 2020-10-28 DIAGNOSIS — E782 Mixed hyperlipidemia: Secondary | ICD-10-CM

## 2020-10-28 HISTORY — DX: Obesity, unspecified: E66.9

## 2020-10-28 NOTE — Patient Instructions (Signed)

## 2020-10-28 NOTE — Progress Notes (Signed)
Cardiology Office Note:    Date:  10/28/2020   ID:  Jillian Taylor, DOB May 09, 1951, MRN [id number]  PCP:  Ailene Ravel, MD  Cardiologist:  Garwin Brothers, MD   Referring MD: Ailene Ravel, MD    ASSESSMENT:    1. Essential hypertension   2. Mixed dyslipidemia   3. Morbid obesity (HCC)   4. Obesity (BMI 35.0-39.9 without comorbidity)    PLAN:    In order of problems listed above:  1. Primary prevention stressed with the patient.  Importance of compliance with diet medication stressed and she vocalized understanding.  Importance of regular exercise stressed and she was advised to walk 5 days a week at least half an hour a day and she promises to do so. 2. Essential hypertension: Blood pressure stable and diet was emphasized.  Lifestyle modification and salt intake issues were discussed.  Echocardiogram report and left ventricular hypertrophy diagnosis was discussed. 3. Mixed dyslipidemia: The KPN sheet revealed significant dyslipidemia and diet was emphasized.  This will be followed by primary care physician and she has a follow-up appointment with [place]. 4. Obesity: Risks of obesity explained and she plans to do better already.  She is already on track with a better diet and exercise. Patient will be seen in follow-up appointment in 6 months or earlier if the patient has any concerns    Medication Adjustments/Labs and Tests Ordered: Current medicines are reviewed at length with the patient today.  Concerns regarding medicines are outlined above.  Orders Placed This Encounter  Procedures  . EKG 12-Lead   No orders of the defined types were placed in this encounter.    No chief complaint on file.    History of Present Illness:    Jillian Taylor is a 69 y.o. female.  Patient has past medical history of essential hypertension.  She denies any problems at this time and takes care of activities of daily living.  No chest pain orthopnea or PND.  At the time of my  evaluation, the patient is alert awake oriented and in no distress.  Past Medical History:  Diagnosis Date  . Allergic rhinoconjunctivitis 11/08/2015  . Alopecia areata 07/02/2007   Qualifier: Diagnosis of  By: Audria Nine MD, Britta Mccreedy    . ANXIETY DEPRESSION 07/02/2007   Qualifier: Diagnosis of  By: Audria Nine MD, Britta Mccreedy    . Arthritis   . Aseptic necrosis of head or neck of femur 12/08/2011  . Asthma   . BACK PAIN, LUMBAR, CHRONIC 07/02/2007   Annotation: 5 mm retrolisthesis L5 on L4 Qualifier: Diagnosis of  By: Audria Nine MD, Britta Mccreedy    . Blind   . BPPV (benign paroxysmal positional vertigo), right 04/15/2020  . Chest discomfort 03/24/2019  . CONSTIPATION, RECURRENT 07/02/2007   Qualifier: Diagnosis of  By: Audria Nine MD, Britta Mccreedy    . Eczema   . Essential hypertension 03/24/2019  . Hearing loss 04/15/2020  . Hypertension   . LPRD (laryngopharyngeal reflux disease) 07/02/2007   Qualifier: Diagnosis of  By: Audria Nine MD, Britta Mccreedy    . MENOPAUSAL SYNDROME 07/02/2007   Qualifier: Diagnosis of  By: Audria Nine MD, Britta Mccreedy    . Mild intermittent asthma 07/02/2007   Annotation: Possible Reactive airway disease Qualifier: Diagnosis of  By: Audria Nine MD, Britta Mccreedy    . Mixed dyslipidemia 03/24/2019  . Morbid obesity (HCC) 12/08/2011  . Overweight 03/24/2019  . PULMONARY NODULE 07/02/2007   Qualifier: History of  By: Audria Nine MD, Britta Mccreedy    . TOBACCO USE, QUIT  07/02/2007   Qualifier: Diagnosis of  By: Audria Nine MD, Britta Mccreedy    . 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 CHOLECYSTECTOMY    . EYE SURGERY    . LUNG BIOPSY    . TONSILLECTOMY      Current Medications: Current Meds  Medication Sig  . albuterol (PROAIR HFA) 108 (90 BASE) MCG/ACT inhaler Inhale 2 puffs into the lungs every 4 (four) hours as needed for wheezing or shortness of breath.  Marland Kitchen amLODipine (NORVASC) 2.5 MG tablet  TAKE 1 TABLET BY MOUTH EVERY DAY  . cetirizine HCl (ZYRTEC) 1 MG/ML solution Take 10 mg by mouth daily as needed (allergies).   . Cholecalciferol (VITAMIN D PO) Take 1 capsule by mouth daily.  . Cyanocobalamin (B-12 PO) Take 1 tablet by mouth daily.  . diazepam (VALIUM) 2 MG tablet Take 2 mg by mouth every 12 (twelve) hours as needed for anxiety.   . hydrochlorothiazide (HYDRODIURIL) 12.5 MG tablet Take 12.5 mg by mouth every morning.  Marland Kitchen HYDROcodone-acetaminophen (NORCO/VICODIN) 5-325 MG tablet Take 1 tablet by mouth 2 (two) times daily as needed for moderate pain or severe pain.   Marland Kitchen ipratropium-albuterol (DUONEB) 0.5-2.5 (3) MG/3ML SOLN Take 3 mLs by nebulization every 4 (four) hours as needed.  . nitroGLYCERIN (NITROSTAT) 0.4 MG SL tablet Place 0.4 mg under the tongue every 5 (five) minutes as needed for chest pain.   Marland Kitchen PRED FORTE 1 % ophthalmic suspension 1 drop in the left eye 4 times daily and 1 drop in the right eye 3 times      Allergies:   Methotrexate, Methotrexate derivatives, Penicillins, and Ciprofloxacin   Social History   Socioeconomic History  . Marital status: Single    Spouse name: Not on file  . Number of children: Not on file  . Years of education: Not on file  . Highest education level: Not on file  Occupational History  . Not on file  Tobacco Use  . Smoking status: Former Games developer  . Smokeless tobacco: Never Used  Substance and Sexual Activity  . Alcohol use: Yes    Alcohol/week: 2.0 standard drinks    Types: 1 Glasses of wine, 1 Cans of beer per week  . Drug use: No  . Sexual activity: Not on file  Other Topics Concern  . Not on file  Social History Narrative  . Not on file   Social Determinants of Health   Financial Resource Strain:   . Difficulty of Paying Living Expenses: Not on file  Food Insecurity:   . Worried About Programme researcher, broadcasting/film/video in the Last Year: Not on file  . Ran Out of Food in the Last Year: Not on file  Transportation Needs:   . Lack  of Transportation (Medical): Not on file  . Lack of Transportation (Non-Medical): Not on file  Physical Activity:   . Days of Exercise per Week: Not on file  . Minutes of Exercise per Session: Not on file  Stress:   . Feeling of Stress : Not on file  Social Connections:   . Frequency of Communication with Friends and Family: Not on file  . Frequency of Social Gatherings with Friends and Family: Not on file  . Attends Religious Services: Not on file  . Active Member of Clubs or Organizations: Not on file  . Attends Banker Meetings: Not on  file  . Marital Status: Not on file     Family History: The patient's family history includes Allergic Disorder in her brother; Allergic rhinitis in her sister; Asthma in her brother, sister, and sister.  ROS:   Please see the history of present illness.    All other systems reviewed and are negative.  EKGs/Labs/Other Studies Reviewed:    The following studies were reviewed today: EKG reveals sinus rhythm and nonspecific ST-T changes   Recent Labs: 07/30/2020: ALT 18; BUN 13; Creatinine, Ser 0.76; Hemoglobin 12.1; Platelets 218; Potassium 4.5; Sodium 138  Recent Lipid Panel    Component Value Date/Time   CHOL 197 06/09/2008 2014   TRIG 101 06/09/2008 2014   HDL 73 06/09/2008 2014   CHOLHDL 2.7 Ratio 06/09/2008 2014   VLDL 20 06/09/2008 2014   LDLCALC 104 (H) 06/09/2008 2014    Physical Exam:    VS:  BP (!) 144/84   Pulse 69   Ht 5\' 6"  (1.676 m)   Wt 240 lb (108.9 kg)   SpO2 98%   BMI 38.74 kg/m     Wt Readings from Last 3 Encounters:  10/28/20 240 lb (108.9 kg)  07/30/20 240 lb (108.9 kg)  11/17/19 245 lb (111.1 kg)     GEN: Patient is in no acute distress HEENT: Normal NECK: No JVD; No carotid bruits LYMPHATICS: No lymphadenopathy CARDIAC: Hear sounds regular, 2/6 systolic murmur at the apex. RESPIRATORY:  Clear to auscultation without rales, wheezing or rhonchi  ABDOMEN: Soft, non-tender,  non-distended MUSCULOSKELETAL:  No edema; No deformity  SKIN: Warm and dry NEUROLOGIC:  Alert and oriented x 3 PSYCHIATRIC:  Normal affect   Signed, 11/19/19, MD  10/28/2020 2:52 PM    [facility]

## 2020-11-10 DIAGNOSIS — M79632 Pain in left forearm: Secondary | ICD-10-CM | POA: Diagnosis not present

## 2020-11-10 DIAGNOSIS — M7661 Achilles tendinitis, right leg: Secondary | ICD-10-CM | POA: Diagnosis not present

## 2020-11-10 DIAGNOSIS — E785 Hyperlipidemia, unspecified: Secondary | ICD-10-CM | POA: Diagnosis not present

## 2020-11-11 DIAGNOSIS — J45909 Unspecified asthma, uncomplicated: Secondary | ICD-10-CM | POA: Diagnosis not present

## 2020-11-18 DIAGNOSIS — J45909 Unspecified asthma, uncomplicated: Secondary | ICD-10-CM | POA: Diagnosis not present

## 2020-12-01 ENCOUNTER — Ambulatory Visit: Payer: Medicare Other | Admitting: Sports Medicine

## 2020-12-03 ENCOUNTER — Ambulatory Visit (INDEPENDENT_AMBULATORY_CARE_PROVIDER_SITE_OTHER): Payer: Medicare Other

## 2020-12-03 ENCOUNTER — Other Ambulatory Visit: Payer: Self-pay

## 2020-12-03 ENCOUNTER — Encounter: Payer: Self-pay | Admitting: Sports Medicine

## 2020-12-03 ENCOUNTER — Ambulatory Visit (INDEPENDENT_AMBULATORY_CARE_PROVIDER_SITE_OTHER): Payer: Medicare Other | Admitting: Sports Medicine

## 2020-12-03 DIAGNOSIS — M79671 Pain in right foot: Secondary | ICD-10-CM | POA: Diagnosis not present

## 2020-12-03 DIAGNOSIS — H540X33 Blindness right eye category 3, blindness left eye category 3: Secondary | ICD-10-CM | POA: Diagnosis not present

## 2020-12-03 DIAGNOSIS — M7660 Achilles tendinitis, unspecified leg: Secondary | ICD-10-CM

## 2020-12-03 DIAGNOSIS — M7661 Achilles tendinitis, right leg: Secondary | ICD-10-CM | POA: Diagnosis not present

## 2020-12-03 MED ORDER — PREDNISONE 10 MG (21) PO TBPK
ORAL_TABLET | ORAL | 0 refills | Status: DC
Start: 2020-12-03 — End: 2021-02-04

## 2020-12-03 NOTE — Progress Notes (Signed)
Subjective: Jillian Taylor is a 69 y.o. female patient who presents to office for evaluation of right heel pain reports that her heel pain has slowly gotten worse over the last 6 months reports that previous to her pain she was doing a lot of walking and due to Covid stop doing walking and gained more weight and her heel has been bothering her worse at bedtime and worse with pressure to the back of the heel.  Patient reports occasional sharp shooting pain to the heel and reports that she is using a brace that has not helped.  Patient also stretching in the morning and admits some swelling reports that she broke her ankle on this foot back in 2005.  Patient denies any other pedal complaints at this time.  Patient is assisted by her caregiver Diane at this visit.  Patient Active Problem List   Diagnosis Date Noted  . Obesity (BMI 35.0-39.9 without comorbidity) 10/28/2020  . Arthritis   . Asthma   . Eczema   . Blind 04/15/2020  . BPPV (benign paroxysmal positional vertigo), right 04/15/2020  . Hearing loss 04/15/2020  . Essential hypertension 03/24/2019  . Overweight 03/24/2019  . Chest discomfort 03/24/2019  . Mixed dyslipidemia 03/24/2019  . Allergic rhinoconjunctivitis 11/08/2015  . Aseptic necrosis of head or neck of femur 12/08/2011  . Morbid obesity (HCC) 12/08/2011  . ANXIETY DEPRESSION 07/02/2007  . UVEITIS 07/02/2007  . Mild intermittent asthma 07/02/2007  . PULMONARY NODULE 07/02/2007  . CONSTIPATION, RECURRENT 07/02/2007  . MENOPAUSAL SYNDROME 07/02/2007  . ALOPECIA AREATA 07/02/2007  . BACK PAIN, LUMBAR, CHRONIC 07/02/2007  . LPRD (laryngopharyngeal reflux disease) 07/02/2007  . TOBACCO USE, QUIT 07/02/2007    Current Outpatient Medications on File Prior to Visit  Medication Sig Dispense Refill  . albuterol (PROAIR HFA) 108 (90 BASE) MCG/ACT inhaler Inhale 2 puffs into the lungs every 4 (four) hours as needed for wheezing or shortness of breath.    Marland Kitchen amLODipine  (NORVASC) 2.5 MG tablet TAKE 1 TABLET BY MOUTH EVERY DAY 180 tablet 0  . cetirizine HCl (ZYRTEC) 1 MG/ML solution Take 10 mg by mouth daily as needed (allergies).     . Cholecalciferol (VITAMIN D PO) Take 1 capsule by mouth daily.    . Cyanocobalamin (B-12 PO) Take 1 tablet by mouth daily.    . diazepam (VALIUM) 2 MG tablet Take 2 mg by mouth every 12 (twelve) hours as needed for anxiety.     . hydrochlorothiazide (HYDRODIURIL) 12.5 MG tablet Take 12.5 mg by mouth every morning.    Marland Kitchen HYDROcodone-acetaminophen (NORCO/VICODIN) 5-325 MG tablet Take 1 tablet by mouth 2 (two) times daily as needed for moderate pain or severe pain.     Marland Kitchen ipratropium-albuterol (DUONEB) 0.5-2.5 (3) MG/3ML SOLN Take 3 mLs by nebulization every 4 (four) hours as needed. 360 mL 3  . nitroGLYCERIN (NITROSTAT) 0.4 MG SL tablet Place 0.4 mg under the tongue every 5 (five) minutes as needed for chest pain.     Marland Kitchen PRED FORTE 1 % ophthalmic suspension 1 drop in the left eye 4 times daily and 1 drop in the right eye 3 times     . rosuvastatin (CRESTOR) 10 MG tablet Take 10 mg by mouth at bedtime.     No current facility-administered medications on file prior to visit.    Allergies  Allergen Reactions  . Methotrexate Hives  . Methotrexate Derivatives Hives  . Penicillins Hives    Has patient had a PCN reaction causing immediate  rash, facial/tongue/throat swelling, SOB or lightheadedness with hypotension: Yes Has patient had a PCN reaction causing severe rash involving mucus membranes or skin necrosis: Yes Has patient had a PCN reaction that required hospitalization No Has patient had a PCN reaction occurring within the last 10 years: No If all of the above answers are "NO", then may proceed with Cephalosporin use.   . Ciprofloxacin Rash    Reported by patient     Objective:  General: Alert and oriented x3 in no acute distress but is blind  Dermatology: No open lesions bilateral lower extremities, no webspace  macerations, no ecchymosis bilateral, all nails x 10 are well manicured.  Vascular: Dorsalis Pedis and Posterior Tibial pedal pulses 1/4, Capillary Fill Time 3 seconds, + pedal hair growth bilateral, focal edema bilateral lower extremities worse at the right posterior heel, Temperature gradient within normal limits.  Neurology: Michaell Cowing sensation intact via light touch bilateral.  Musculoskeletal: Mild to moderate tenderness with palpation at insertion of the Achilles on Right, there is calcaneal exostosis with mild soft tissue swelling present and decreased ankle rom with knee extending  vs flexed resembling gastroc equnius bilateral, The achilles tendon feels intact with no nodularity or palpable dell, Thompson sign negative.   Xrays  Right foot   Impression: Normal osseous mineralization. Joint spaces preserved. No fracture/dislocation/boney destruction. Calcaneal spur present. Kager's triangle intact with no obliteration. No soft tissue abnormalities or radiopaque foreign bodies.   Assessment and Plan: Problem List Items Addressed This Visit      Other   Blind    Other Visit Diagnoses    Achilles tendon pain    -  Primary   Relevant Orders   DG Foot Complete Right   Achilles tendinitis, right leg       Right foot pain           -Complete examination performed -Xrays reviewed -Discussed treatment options for Achilles tendinitis -Rx prednisone Dosepak, heel lifts, gentle stretching -No improvement will consider PT/EPAT -Patient is not a candidate for a cam boot due to her blindness -Patient to return to office in 1 month or sooner if condition worsens.  Asencion Islam, DPM

## 2020-12-07 DIAGNOSIS — H547 Unspecified visual loss: Secondary | ICD-10-CM | POA: Diagnosis not present

## 2020-12-07 DIAGNOSIS — R633 Feeding difficulties, unspecified: Secondary | ICD-10-CM | POA: Diagnosis not present

## 2020-12-07 DIAGNOSIS — E559 Vitamin D deficiency, unspecified: Secondary | ICD-10-CM | POA: Diagnosis not present

## 2020-12-07 DIAGNOSIS — E538 Deficiency of other specified B group vitamins: Secondary | ICD-10-CM | POA: Diagnosis not present

## 2020-12-18 DIAGNOSIS — J45909 Unspecified asthma, uncomplicated: Secondary | ICD-10-CM | POA: Diagnosis not present

## 2021-01-04 DIAGNOSIS — J45909 Unspecified asthma, uncomplicated: Secondary | ICD-10-CM | POA: Diagnosis not present

## 2021-01-06 DIAGNOSIS — E538 Deficiency of other specified B group vitamins: Secondary | ICD-10-CM | POA: Diagnosis not present

## 2021-01-06 DIAGNOSIS — E559 Vitamin D deficiency, unspecified: Secondary | ICD-10-CM | POA: Diagnosis not present

## 2021-01-06 DIAGNOSIS — H547 Unspecified visual loss: Secondary | ICD-10-CM | POA: Diagnosis not present

## 2021-01-06 DIAGNOSIS — R633 Feeding difficulties, unspecified: Secondary | ICD-10-CM | POA: Diagnosis not present

## 2021-01-14 ENCOUNTER — Ambulatory Visit: Payer: Medicare Other | Admitting: Sports Medicine

## 2021-01-18 DIAGNOSIS — J45909 Unspecified asthma, uncomplicated: Secondary | ICD-10-CM | POA: Diagnosis not present

## 2021-01-24 ENCOUNTER — Other Ambulatory Visit: Payer: Self-pay | Admitting: Cardiology

## 2021-01-24 NOTE — Telephone Encounter (Signed)
Rx refill sent to pharmacy. 

## 2021-01-25 ENCOUNTER — Ambulatory Visit: Payer: Medicare Other | Admitting: Sports Medicine

## 2021-02-04 ENCOUNTER — Encounter: Payer: Self-pay | Admitting: Sports Medicine

## 2021-02-04 ENCOUNTER — Ambulatory Visit (INDEPENDENT_AMBULATORY_CARE_PROVIDER_SITE_OTHER): Payer: Medicare Other | Admitting: Sports Medicine

## 2021-02-04 ENCOUNTER — Other Ambulatory Visit: Payer: Self-pay

## 2021-02-04 DIAGNOSIS — M79671 Pain in right foot: Secondary | ICD-10-CM | POA: Diagnosis not present

## 2021-02-04 DIAGNOSIS — M7661 Achilles tendinitis, right leg: Secondary | ICD-10-CM

## 2021-02-04 DIAGNOSIS — M7660 Achilles tendinitis, unspecified leg: Secondary | ICD-10-CM

## 2021-02-04 DIAGNOSIS — H540X33 Blindness right eye category 3, blindness left eye category 3: Secondary | ICD-10-CM | POA: Diagnosis not present

## 2021-02-04 NOTE — Progress Notes (Signed)
Subjective: Jillian Taylor is a 70 y.o. female patient who returns to office for follow-up evaluation of right heel pain.  Patient reports that pain is doing better she has good days and she has bad days she ices stretch she use the pads and the medicine did help.  Denies any other pedal complaints at this time except a long right fifth toenail that sometimes splits.  Patient is assisted by her caregiver Diane at this visit.  Patient Active Problem List   Diagnosis Date Noted  . Obesity (BMI 35.0-39.9 without comorbidity) 10/28/2020  . Arthritis   . Asthma   . Eczema   . Blind 04/15/2020  . BPPV (benign paroxysmal positional vertigo), right 04/15/2020  . Hearing loss 04/15/2020  . Essential hypertension 03/24/2019  . Overweight 03/24/2019  . Chest discomfort 03/24/2019  . Mixed dyslipidemia 03/24/2019  . Allergic rhinoconjunctivitis 11/08/2015  . Aseptic necrosis of head or neck of femur 12/08/2011  . Morbid obesity (HCC) 12/08/2011  . ANXIETY DEPRESSION 07/02/2007  . UVEITIS 07/02/2007  . Mild intermittent asthma 07/02/2007  . PULMONARY NODULE 07/02/2007  . CONSTIPATION, RECURRENT 07/02/2007  . MENOPAUSAL SYNDROME 07/02/2007  . ALOPECIA AREATA 07/02/2007  . BACK PAIN, LUMBAR, CHRONIC 07/02/2007  . LPRD (laryngopharyngeal reflux disease) 07/02/2007  . TOBACCO USE, QUIT 07/02/2007    Current Outpatient Medications on File Prior to Visit  Medication Sig Dispense Refill  . albuterol (PROAIR HFA) 108 (90 BASE) MCG/ACT inhaler Inhale 2 puffs into the lungs every 4 (four) hours as needed for wheezing or shortness of breath.    Marland Kitchen amLODipine (NORVASC) 2.5 MG tablet TAKE 1 TABLET BY MOUTH EVERY DAY 180 tablet 0  . cetirizine HCl (ZYRTEC) 1 MG/ML solution Take 10 mg by mouth daily as needed (allergies).     . Cholecalciferol (VITAMIN D PO) Take 1 capsule by mouth daily.    . Cyanocobalamin (B-12 PO) Take 1 tablet by mouth daily.    . diazepam (VALIUM) 2 MG tablet Take 2 mg by mouth  every 12 (twelve) hours as needed for anxiety.     . hydrochlorothiazide (HYDRODIURIL) 12.5 MG tablet Take 12.5 mg by mouth every morning.    Marland Kitchen HYDROcodone-acetaminophen (NORCO/VICODIN) 5-325 MG tablet Take 1 tablet by mouth 2 (two) times daily as needed for moderate pain or severe pain.     Marland Kitchen ipratropium-albuterol (DUONEB) 0.5-2.5 (3) MG/3ML SOLN Take 3 mLs by nebulization every 4 (four) hours as needed. 360 mL 3  . nitroGLYCERIN (NITROSTAT) 0.4 MG SL tablet Place 0.4 mg under the tongue every 5 (five) minutes as needed for chest pain.     Marland Kitchen PRED FORTE 1 % ophthalmic suspension 1 drop in the left eye 4 times daily and 1 drop in the right eye 3 times     . rosuvastatin (CRESTOR) 10 MG tablet Take 10 mg by mouth at bedtime.    Marland Kitchen scopolamine (TRANSDERM-SCOP) 1 MG/3DAYS 1 (ONE) PATCH EVERY 3 DAYS AS NEEDED FOR VERTIGO (APPLY TO HAIRLESS AREA BEHIND EAR)     No current facility-administered medications on file prior to visit.    Allergies  Allergen Reactions  . Methotrexate Hives  . Methotrexate Derivatives Hives  . Penicillins Hives    Has patient had a PCN reaction causing immediate rash, facial/tongue/throat swelling, SOB or lightheadedness with hypotension: Yes Has patient had a PCN reaction causing severe rash involving mucus membranes or skin necrosis: Yes Has patient had a PCN reaction that required hospitalization No Has patient had a PCN reaction  occurring within the last 10 years: No If all of the above answers are "NO", then may proceed with Cephalosporin use.   . Ciprofloxacin Rash    Reported by patient     Objective:  General: Alert and oriented x3 in no acute distress but is blind  Dermatology: No open lesions bilateral lower extremities, no webspace macerations, no ecchymosis bilateral, all nails x 10 are well manicured except right fifth toe that is mildly elongated.  Vascular: Dorsalis Pedis and Posterior Tibial pedal pulses 1/4, Capillary Fill Time 3 seconds, + pedal  hair growth bilateral, focal edema bilateral lower extremities worse at the right posterior heel, Temperature gradient within normal limits.  Neurology: Michaell Cowing sensation intact via light touch bilateral.  Musculoskeletal: Mild tenderness with palpation at insertion of the Achilles on Right, there is calcaneal exostosis with mild soft tissue swelling present and decreased ankle rom with knee extending  vs flexed resembling gastroc equnius bilateral, The achilles tendon feels intact with no nodularity or palpable dell, Thompson sign negative.   Assessment and Plan: Problem List Items Addressed This Visit      Other   Blind    Other Visit Diagnoses    Achilles tendon pain    -  Primary   Achilles tendinitis, right leg       Right foot pain         -Complete exam performed -Re-Discussed treatment options for Achilles tendinitis -Dispensed night splint for patient to use as instructed  -Dispensed again heel lifts and recommend patient to continue with gentle stretching -No improvement to consider physical therapy -At no additional charge mechanically debrided right fifth toenail using a sterile nail nipper without incident -Patient to return to office as needed or sooner if condition worsens.  Asencion Islam, DPM

## 2021-02-07 DIAGNOSIS — H547 Unspecified visual loss: Secondary | ICD-10-CM | POA: Diagnosis not present

## 2021-02-07 DIAGNOSIS — E538 Deficiency of other specified B group vitamins: Secondary | ICD-10-CM | POA: Diagnosis not present

## 2021-02-07 DIAGNOSIS — J45909 Unspecified asthma, uncomplicated: Secondary | ICD-10-CM | POA: Diagnosis not present

## 2021-02-07 DIAGNOSIS — E559 Vitamin D deficiency, unspecified: Secondary | ICD-10-CM | POA: Diagnosis not present

## 2021-02-07 DIAGNOSIS — R633 Feeding difficulties, unspecified: Secondary | ICD-10-CM | POA: Diagnosis not present

## 2021-02-10 DIAGNOSIS — J309 Allergic rhinitis, unspecified: Secondary | ICD-10-CM | POA: Diagnosis not present

## 2021-02-10 DIAGNOSIS — R42 Dizziness and giddiness: Secondary | ICD-10-CM | POA: Diagnosis not present

## 2021-02-18 DIAGNOSIS — J45909 Unspecified asthma, uncomplicated: Secondary | ICD-10-CM | POA: Diagnosis not present

## 2021-02-25 DIAGNOSIS — M545 Low back pain, unspecified: Secondary | ICD-10-CM | POA: Diagnosis not present

## 2021-02-26 ENCOUNTER — Emergency Department (HOSPITAL_COMMUNITY): Payer: Medicare Other

## 2021-02-26 ENCOUNTER — Emergency Department (HOSPITAL_COMMUNITY)
Admission: EM | Admit: 2021-02-26 | Discharge: 2021-02-27 | Disposition: A | Discharge status: Home / Self Care | Payer: Medicare Other | Attending: Emergency Medicine | Admitting: Emergency Medicine

## 2021-02-26 ENCOUNTER — Encounter (HOSPITAL_COMMUNITY): Payer: Self-pay | Admitting: Emergency Medicine

## 2021-02-26 DIAGNOSIS — S39012A Strain of muscle, fascia and tendon of lower back, initial encounter: Secondary | ICD-10-CM | POA: Diagnosis not present

## 2021-02-26 DIAGNOSIS — I10 Essential (primary) hypertension: Secondary | ICD-10-CM | POA: Diagnosis not present

## 2021-02-26 DIAGNOSIS — M47819 Spondylosis without myelopathy or radiculopathy, site unspecified: Secondary | ICD-10-CM | POA: Diagnosis not present

## 2021-02-26 DIAGNOSIS — I700 Atherosclerosis of aorta: Secondary | ICD-10-CM | POA: Diagnosis not present

## 2021-02-26 DIAGNOSIS — R0689 Other abnormalities of breathing: Secondary | ICD-10-CM | POA: Diagnosis not present

## 2021-02-26 DIAGNOSIS — J45909 Unspecified asthma, uncomplicated: Secondary | ICD-10-CM | POA: Diagnosis not present

## 2021-02-26 DIAGNOSIS — X58XXXA Exposure to other specified factors, initial encounter: Secondary | ICD-10-CM | POA: Diagnosis not present

## 2021-02-26 DIAGNOSIS — Z87891 Personal history of nicotine dependence: Secondary | ICD-10-CM | POA: Insufficient documentation

## 2021-02-26 DIAGNOSIS — M549 Dorsalgia, unspecified: Secondary | ICD-10-CM | POA: Diagnosis not present

## 2021-02-26 DIAGNOSIS — R109 Unspecified abdominal pain: Secondary | ICD-10-CM | POA: Diagnosis not present

## 2021-02-26 DIAGNOSIS — M4316 Spondylolisthesis, lumbar region: Secondary | ICD-10-CM | POA: Diagnosis not present

## 2021-02-26 DIAGNOSIS — S3992XA Unspecified injury of lower back, initial encounter: Secondary | ICD-10-CM | POA: Diagnosis present

## 2021-02-26 DIAGNOSIS — R1111 Vomiting without nausea: Secondary | ICD-10-CM | POA: Diagnosis not present

## 2021-02-26 DIAGNOSIS — Z79899 Other long term (current) drug therapy: Secondary | ICD-10-CM | POA: Insufficient documentation

## 2021-02-26 DIAGNOSIS — Z743 Need for continuous supervision: Secondary | ICD-10-CM | POA: Diagnosis not present

## 2021-02-26 DIAGNOSIS — M545 Low back pain, unspecified: Secondary | ICD-10-CM | POA: Diagnosis not present

## 2021-02-26 LAB — CBC
HCT: 40.5 % (ref 36.0–46.0)
Hemoglobin: 12 g/dL (ref 12.0–15.0)
MCH: 21.5 pg — ABNORMAL LOW (ref 26.0–34.0)
MCHC: 29.6 g/dL — ABNORMAL LOW (ref 30.0–36.0)
MCV: 72.7 fL — ABNORMAL LOW (ref 80.0–100.0)
Platelets: 219 10*3/uL (ref 150–400)
RBC: 5.57 MIL/uL — ABNORMAL HIGH (ref 3.87–5.11)
RDW: 15.5 % (ref 11.5–15.5)
WBC: 10.1 10*3/uL (ref 4.0–10.5)
nRBC: 0 % (ref 0.0–0.2)

## 2021-02-26 LAB — BASIC METABOLIC PANEL
Anion gap: 8 (ref 5–15)
BUN: 12 mg/dL (ref 8–23)
CO2: 26 mmol/L (ref 22–32)
Calcium: 9.6 mg/dL (ref 8.9–10.3)
Chloride: 106 mmol/L (ref 98–111)
Creatinine, Ser: 1.03 mg/dL — ABNORMAL HIGH (ref 0.44–1.00)
GFR, Estimated: 59 mL/min — ABNORMAL LOW (ref >60)
Glucose, Bld: 154 mg/dL — ABNORMAL HIGH (ref 70–99)
Potassium: 4.7 mmol/L (ref 3.5–5.1)
Sodium: 140 mmol/L (ref 135–145)

## 2021-02-26 NOTE — ED Triage Notes (Signed)
Pt transported from home by [facility], pt c/o R low back pain for several days, given Toradol by PCP, pain has continued. # 20 L AC, Fentanyl and Zofran 4mg  given.  ** PT IS VISUALLY IMPAIRED**

## 2021-02-27 DIAGNOSIS — S39012A Strain of muscle, fascia and tendon of lower back, initial encounter: Secondary | ICD-10-CM | POA: Diagnosis not present

## 2021-02-27 LAB — URINALYSIS, ROUTINE W REFLEX MICROSCOPIC
Bilirubin Urine: NEGATIVE
Glucose, UA: NEGATIVE mg/dL
Hgb urine dipstick: NEGATIVE
Ketones, ur: NEGATIVE mg/dL
Leukocytes,Ua: NEGATIVE
Nitrite: NEGATIVE
Protein, ur: NEGATIVE mg/dL
Specific Gravity, Urine: 1.019 (ref 1.005–1.030)
pH: 6 (ref 5.0–8.0)

## 2021-02-27 MED ORDER — OXYCODONE-ACETAMINOPHEN 5-325 MG PO TABS: 1.0000 | ORAL_TABLET | ORAL | 0 refills | Status: DC | PRN

## 2021-02-27 MED ORDER — FENTANYL CITRATE (PF) 100 MCG/2ML IJ SOLN
50.0000 ug | Freq: Once | INTRAMUSCULAR | Status: AC
  Administered 2021-02-27: 50 ug via INTRAVENOUS
  Filled 2021-02-27: qty 2

## 2021-02-27 NOTE — ED Provider Notes (Signed)
[facility] EMERGENCY DEPARTMENT Provider Note   CSN: [id number] Arrival date & time: 02/26/21  2022     History Chief Complaint  Patient presents with  . Back Pain    Jillian Taylor is a 70 y.o. female.  Patient presents to the emergency department for evaluation of left-sided low back and flank pain.  Symptoms ongoing for a couple of days.  She reports that she moved a mattress the day before but did not feel any pain at that time.  Patient reports that the pain seems to worsen at night.  The pain seems to hit her suddenly and get quite severe at times.  She has used Tylenol with some improvement.  She saw her doctor yesterday and was prescribed prednisone and baclofen.  She has not tried the baclofen.  She was given a shot of Toradol in the office that did seem to help as well.  She has not noticed any urinary symptoms.  Pain does not radiate to the leg, does seem to come around to the front of the abdomen at times.        Past Medical History:  Diagnosis Date  . Allergic rhinoconjunctivitis 11/08/2015  . Alopecia areata 07/02/2007   Qualifier: Diagnosis of  By: Audria Nine MD, Britta Mccreedy    . ANXIETY DEPRESSION 07/02/2007   Qualifier: Diagnosis of  By: Audria Nine MD, Britta Mccreedy    . Arthritis   . Aseptic necrosis of head or neck of femur 12/08/2011  . Asthma   . BACK PAIN, LUMBAR, CHRONIC 07/02/2007   Annotation: 5 mm retrolisthesis L5 on L4 Qualifier: Diagnosis of  By: Audria Nine MD, Britta Mccreedy    . Blind   . BPPV (benign paroxysmal positional vertigo), right 04/15/2020  . Chest discomfort 03/24/2019  . CONSTIPATION, RECURRENT 07/02/2007   Qualifier: Diagnosis of  By: Audria Nine MD, Britta Mccreedy    . Eczema   . Essential hypertension 03/24/2019  . Hearing loss 04/15/2020  . Hypertension   . LPRD (laryngopharyngeal reflux disease) 07/02/2007   Qualifier: Diagnosis of  By: Audria Nine MD, Britta Mccreedy    . MENOPAUSAL SYNDROME 07/02/2007   Qualifier: Diagnosis of  By: Audria Nine MD, Britta Mccreedy     . Mild intermittent asthma 07/02/2007   Annotation: Possible Reactive airway disease Qualifier: Diagnosis of  By: Audria Nine MD, Britta Mccreedy    . Mixed dyslipidemia 03/24/2019  . Morbid obesity (HCC) 12/08/2011  . Overweight 03/24/2019  . PULMONARY NODULE 07/02/2007   Qualifier: History of  By: Audria Nine MD, Britta Mccreedy    . TOBACCO USE, QUIT 07/02/2007   Qualifier: Diagnosis of  By: Audria Nine MD, Britta Mccreedy    . 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 Obesity (BMI 35.0-39.9 without comorbidity) 10/28/2020  . Arthritis   . Asthma   . Eczema   . Blind 04/15/2020  . BPPV (benign paroxysmal positional vertigo), right 04/15/2020  . Hearing loss 04/15/2020  . Essential hypertension 03/24/2019  . Overweight 03/24/2019  . Chest discomfort 03/24/2019  . Mixed dyslipidemia 03/24/2019  . Allergic rhinoconjunctivitis 11/08/2015  . Aseptic necrosis of head or neck of femur 12/08/2011  . Morbid obesity (HCC) 12/08/2011  . ANXIETY DEPRESSION 07/02/2007  . UVEITIS 07/02/2007  . Mild intermittent asthma 07/02/2007  . PULMONARY NODULE 07/02/2007  . CONSTIPATION, RECURRENT 07/02/2007  . MENOPAUSAL SYNDROME 07/02/2007  . ALOPECIA AREATA 07/02/2007  . BACK PAIN, LUMBAR, CHRONIC 07/02/2007  .  LPRD (laryngopharyngeal reflux disease) 07/02/2007  . TOBACCO USE, QUIT 07/02/2007    Past Surgical History:  Procedure Laterality Date  . CHOLECYSTECTOMY    . EYE SURGERY    . LUNG BIOPSY    . TONSILLECTOMY       OB History   No obstetric history on file.     Family History  Problem Relation Age of Onset  . Asthma Sister   . Allergic rhinitis Sister   . Asthma Brother   . Allergic Disorder Brother   . Asthma Sister     Social History   Tobacco Use  . Smoking status: Former Games developer  . Smokeless tobacco: Never Used  Substance Use Topics  . Alcohol use: Yes     Alcohol/week: 2.0 standard drinks    Types: 1 Glasses of wine, 1 Cans of beer per week  . Drug use: No    Home Medications Prior to Admission medications   Medication Sig Start Date End Date Taking? Authorizing Provider  albuterol (PROAIR HFA) 108 (90 BASE) MCG/ACT inhaler Inhale 2 puffs into the lungs every 4 (four) hours as needed for wheezing or shortness of breath.    [provider]  amLODipine (NORVASC) 2.5 MG tablet TAKE 1 TABLET BY MOUTH EVERY DAY 01/24/21   Revankar, Aundra Dubin, MD  cetirizine HCl (ZYRTEC) 1 MG/ML solution Take 10 mg by mouth daily as needed (allergies).  06/12/20   [provider]  Cholecalciferol (VITAMIN D PO) Take 1 capsule by mouth daily.    [provider]  Cyanocobalamin (B-12 PO) Take 1 tablet by mouth daily.    [provider]  diazepam (VALIUM) 2 MG tablet Take 2 mg by mouth every 12 (twelve) hours as needed for anxiety.  08/29/16   [provider]  hydrochlorothiazide (HYDRODIURIL) 12.5 MG tablet Take 12.5 mg by mouth every morning. 07/10/20   [provider]  HYDROcodone-acetaminophen (NORCO/VICODIN) 5-325 MG tablet Take 1 tablet by mouth 2 (two) times daily as needed for moderate pain or severe pain.  11/12/19   [provider]  ipratropium-albuterol (DUONEB) 0.5-2.5 (3) MG/3ML SOLN Take 3 mLs by nebulization every 4 (four) hours as needed. 11/17/15   Kozlow, Alvira Philips, MD  nitroGLYCERIN (NITROSTAT) 0.4 MG SL tablet Place 0.4 mg under the tongue every 5 (five) minutes as needed for chest pain.  02/22/20   [provider]  PRED FORTE 1 % ophthalmic suspension 1 drop in the left eye 4 times daily and 1 drop in the right eye 3 times  07/17/16   [provider]  rosuvastatin (CRESTOR) 10 MG tablet Take 10 mg by mouth at bedtime. 11/14/20   [provider]  scopolamine (TRANSDERM-SCOP) 1 MG/3DAYS 1 (ONE) PATCH EVERY 3 DAYS AS NEEDED FOR VERTIGO (APPLY TO HAIRLESS AREA BEHIND EAR) 01/27/21    [provider]    Allergies    Methotrexate, Methotrexate derivatives, Penicillins, and Ciprofloxacin  Review of Systems   Review of Systems  Musculoskeletal: Positive for back pain.  All other systems reviewed and are negative.   Physical Exam Updated Vital Signs BP (!) 151/73 (BP Location: Right Arm)   Pulse 82   Temp 97.8 F (36.6 C)   Resp 16   SpO2 100%   Physical Exam Vitals and nursing note reviewed.  Constitutional:      General: She is not in acute distress.    Appearance: Normal appearance. She is well-developed and well-nourished.  HENT:     Head: Normocephalic  and atraumatic.     Right Ear: Hearing normal.     Left Ear: Hearing normal.     Nose: Nose normal.     Mouth/Throat:     Mouth: Oropharynx is clear and moist and mucous membranes are normal.  Eyes:     Extraocular Movements: EOM normal.     Conjunctiva/sclera: Conjunctivae normal.     Pupils: Pupils are equal, round, and reactive to light.  Cardiovascular:     Rate and Rhythm: Regular rhythm.     Heart sounds: S1 normal and S2 normal. No murmur heard. No friction rub. No gallop.   Pulmonary:     Effort: Pulmonary effort is normal. No respiratory distress.     Breath sounds: Normal breath sounds.  Chest:     Chest wall: No tenderness.  Abdominal:     General: Bowel sounds are normal.     Palpations: Abdomen is soft. There is no hepatosplenomegaly.     Tenderness: There is no abdominal tenderness. There is no guarding or rebound. Negative signs include Murphy's sign and McBurney's sign.     Hernia: No hernia is present.  Musculoskeletal:        General: Normal range of motion.     Cervical back: Normal range of motion and neck supple.  Skin:    General: Skin is warm, dry and intact.     Findings: No rash.     Nails: There is no cyanosis.  Neurological:     Mental Status: She is alert and oriented to person, place, and time.     GCS: GCS eye subscore is 4. GCS verbal subscore is  5. GCS motor subscore is 6.     Cranial Nerves: No cranial nerve deficit.     Sensory: No sensory deficit.     Coordination: Coordination normal.     Deep Tendon Reflexes: Strength normal.  Psychiatric:        Mood and Affect: Mood and affect normal.        Speech: Speech normal.        Behavior: Behavior normal.        Thought Content: Thought content normal.     ED Results / Procedures / Treatments   Labs (all labs ordered are listed, but only abnormal results are displayed) Labs Reviewed  BASIC METABOLIC PANEL - Abnormal; Notable for the following components:      Result Value   Glucose, Bld 154 (*)    Creatinine, Ser 1.03 (*)    GFR, Estimated 59 (*)    All other components within normal limits  CBC - Abnormal; Notable for the following components:   RBC 5.57 (*)    MCV 72.7 (*)    MCH 21.5 (*)    MCHC 29.6 (*)    All other components within normal limits  URINALYSIS, ROUTINE W REFLEX MICROSCOPIC    EKG None  Radiology CT Renal Stone Study  Result Date: 02/26/2021 CLINICAL DATA:  Flank pain, kidney stone suspected. EXAM: CT ABDOMEN AND PELVIS WITHOUT CONTRAST TECHNIQUE: Multidetector CT imaging of the abdomen and pelvis was performed following the standard protocol without IV contrast. COMPARISON:  CT abdomen pelvis 07/30/2020, CT abdomen pelvis 09/04/2017, CT chest 02/01/2002 FINDINGS: Lower chest: Round partially visualized calcified 1.6 cm left lower lobe nodule. Several other smaller calcified pulmonary micronodules within bilateral lung bases. Hepatobiliary: No focal liver abnormality. Status post cholecystectomy. No biliary dilatation. Pancreas: No focal lesion. Normal pancreatic contour. No surrounding inflammatory changes. No main pancreatic  ductal dilatation. Spleen: Normal in size without focal abnormality. Adrenals/Urinary Tract: No adrenal nodule bilaterally. No nephrolithiasis, no hydronephrosis, and no contour-deforming renal mass. No ureterolithiasis or  hydroureter. The urinary bladder is unremarkable. Stomach/Bowel: Stomach is within normal limits. No evidence of bowel wall thickening or dilatation. The appendix is not visualized. Vascular/Lymphatic: No abdominal aorta or iliac aneurysm. Mild atherosclerotic plaque of the aorta and its branches. No abdominal, pelvic, or inguinal lymphadenopathy. Reproductive: Uterus and bilateral adnexa are unremarkable. Other: No intraperitoneal free fluid. No intraperitoneal free gas. No organized fluid collection. Musculoskeletal: No abdominal wall hernia or abnormality No suspicious lytic or blastic osseous lesions. No acute displaced fracture. Multilevel degenerative changes of the spine. Grade 1 anterolisthesis of L4 on L5. IMPRESSION: 1. Multiple calcified pulmonary nodules likely representing granulomas. 2. No acute intra-abdominal or intrapelvic abnormality. 3. Grade 1 anterolisthesis of L4 on L5. Electronically Signed   By: Tish Frederickson M.D.   On: 02/26/2021 22:16    Procedures Procedures   Medications Ordered in ED Medications  fentaNYL (SUBLIMAZE) injection 50 mcg (50 mcg Intravenous Given 02/27/21 0121)    ED Course  I have reviewed the triage vital signs and the nursing notes.  Pertinent labs & imaging results that were available during my care of the patient were reviewed by me and considered in my medical decision making (see chart for details).    MDM Rules/Calculators/A&P                          Patient presents to the emergency department for evaluation of back pain.  Patient with low back pain radiating to the lower abdomen.  No urinary symptoms.  Pain seems to be worse at night and is related to position changes when it is present.  Presentation seems consistent with intermittent lumbar spasm.  Urinalysis is clear, no sign of infection.  CT renal stone study shows no acute pathology.  Lab work is unremarkable as well.  Patient with normal lower extremity strength and sensation, no saddle  anesthesia or foot drop.  She is currently on a prednisone taper pack and was prescribed baclofen.  Will likely require analgesia in addition to this treatment.  She was treated with fentanyl in the ER with improvement.  Final Clinical Impression(s) / ED Diagnoses Final diagnoses:  Strain of lumbar region, initial encounter    Rx / DC Orders ED Discharge Orders    None       Hamp Moreland, Canary Brim, MD 02/27/21 [phone]

## 2021-03-04 DIAGNOSIS — J45909 Unspecified asthma, uncomplicated: Secondary | ICD-10-CM | POA: Diagnosis not present

## 2021-03-07 DIAGNOSIS — R633 Feeding difficulties, unspecified: Secondary | ICD-10-CM | POA: Diagnosis not present

## 2021-03-07 DIAGNOSIS — E559 Vitamin D deficiency, unspecified: Secondary | ICD-10-CM | POA: Diagnosis not present

## 2021-03-07 DIAGNOSIS — E538 Deficiency of other specified B group vitamins: Secondary | ICD-10-CM | POA: Diagnosis not present

## 2021-03-07 DIAGNOSIS — H547 Unspecified visual loss: Secondary | ICD-10-CM | POA: Diagnosis not present

## 2021-03-18 DIAGNOSIS — J45909 Unspecified asthma, uncomplicated: Secondary | ICD-10-CM | POA: Diagnosis not present

## 2021-04-27 DIAGNOSIS — I10 Essential (primary) hypertension: Secondary | ICD-10-CM | POA: Insufficient documentation

## 2021-05-02 DIAGNOSIS — Z9181 History of falling: Secondary | ICD-10-CM | POA: Diagnosis not present

## 2021-05-02 DIAGNOSIS — Z0000 Encounter for general adult medical examination without abnormal findings: Secondary | ICD-10-CM | POA: Diagnosis not present

## 2021-05-02 DIAGNOSIS — E785 Hyperlipidemia, unspecified: Secondary | ICD-10-CM | POA: Diagnosis not present

## 2021-05-05 ENCOUNTER — Encounter: Payer: Self-pay | Admitting: Cardiology

## 2021-05-05 ENCOUNTER — Ambulatory Visit (INDEPENDENT_AMBULATORY_CARE_PROVIDER_SITE_OTHER): Payer: Medicare Other | Admitting: Cardiology

## 2021-05-05 ENCOUNTER — Other Ambulatory Visit: Payer: Self-pay

## 2021-05-05 VITALS — BP 126/76 | HR 70 | Ht 66.0 in | Wt 259.2 lb

## 2021-05-05 DIAGNOSIS — I10 Essential (primary) hypertension: Secondary | ICD-10-CM

## 2021-05-05 DIAGNOSIS — E782 Mixed hyperlipidemia: Secondary | ICD-10-CM | POA: Diagnosis not present

## 2021-05-05 NOTE — Patient Instructions (Signed)

## 2021-05-05 NOTE — Progress Notes (Signed)
Cardiology Office Note:    Date:  05/05/2021   ID:  Jillian Taylor, DOB October 14, 1951, MRN [id number]  PCP:  Ailene Ravel, MD  Cardiologist:  Garwin Brothers, MD   Referring MD: Ailene Ravel, MD    ASSESSMENT:    1. Essential hypertension   2. Morbid obesity (HCC)   3. Mixed dyslipidemia    PLAN:    In order of problems listed above:  1. Primary prevention stressed with the patient.  Importance of compliance with diet medication stressed and she vocalized understanding. 2. Essential hypertension: Blood pressure stable and diet was emphasized.  Lifestyle modification was urged. 3. Mixed dyslipidemia: Tolerating statin therapy well.  This is managed by primary care. 4. Morbid obesity: I discussed with her about compliance issues with diet and exercise she promises to do better.  Risks of obesity explained. 5. Patient will be seen in follow-up appointment in 6 months or earlier if the patient has any concerns    Medication Adjustments/Labs and Tests Ordered: Current medicines are reviewed at length with the patient today.  Concerns regarding medicines are outlined above.  No orders of the defined types were placed in this encounter.  No orders of the defined types were placed in this encounter.    No chief complaint on file.    History of Present Illness:    Jillian Taylor is a 70 y.o. female.  Patient has history of essential hypertension and dyslipidemia.  She is morbidly obese and has gained weight over the past several months.  She is visually impaired.  She denies any problems at this time no chest pain orthopnea or PND.  At the time of my evaluation, the patient is alert awake oriented and in no distress.  Past Medical History:  Diagnosis Date  . Allergic rhinoconjunctivitis 11/08/2015  . Alopecia areata 07/02/2007   Qualifier: Diagnosis of  By: Audria Nine MD, Britta Mccreedy    . ANXIETY DEPRESSION 07/02/2007   Qualifier: Diagnosis of  By: Audria Nine MD, Britta Mccreedy     . Arthritis   . Aseptic necrosis of head or neck of femur 12/08/2011  . Asthma   . BACK PAIN, LUMBAR, CHRONIC 07/02/2007   Annotation: 5 mm retrolisthesis L5 on L4 Qualifier: Diagnosis of  By: Audria Nine MD, Britta Mccreedy    . Blind   . BPPV (benign paroxysmal positional vertigo), right 04/15/2020  . Chest discomfort 03/24/2019  . CONSTIPATION, RECURRENT 07/02/2007   Qualifier: Diagnosis of  By: Audria Nine MD, Britta Mccreedy    . Eczema   . Essential hypertension 03/24/2019  . Hearing loss 04/15/2020  . Hypertension   . LPRD (laryngopharyngeal reflux disease) 07/02/2007   Qualifier: Diagnosis of  By: Audria Nine MD, Britta Mccreedy    . MENOPAUSAL SYNDROME 07/02/2007   Qualifier: Diagnosis of  By: Audria Nine MD, Britta Mccreedy    . Mild intermittent asthma 07/02/2007   Annotation: Possible Reactive airway disease Qualifier: Diagnosis of  By: Audria Nine MD, Britta Mccreedy    . Mixed dyslipidemia 03/24/2019  . Morbid obesity (HCC) 12/08/2011  . Obesity (BMI 35.0-39.9 without comorbidity) 10/28/2020  . Overweight 03/24/2019  . PULMONARY NODULE 07/02/2007   Qualifier: History of  By: Audria Nine MD, Britta Mccreedy    . TOBACCO USE, QUIT 07/02/2007   Qualifier: Diagnosis of  By: Audria Nine MD, Britta Mccreedy    . 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 CHOLECYSTECTOMY    .  EYE SURGERY    . LUNG BIOPSY    . TONSILLECTOMY      Current Medications: Current Meds  Medication Sig  . albuterol (VENTOLIN HFA) 108 (90 Base) MCG/ACT inhaler Inhale 2 puffs into the lungs every 4 (four) hours as needed for wheezing or shortness of breath.  Marland Kitchen amLODipine (NORVASC) 2.5 MG tablet Take 2.5 mg by mouth daily.  . cetirizine HCl (ZYRTEC) 1 MG/ML solution Take 10 mg by mouth daily as needed for allergies (allergies).  . Cholecalciferol (VITAMIN D PO) Take 1 capsule by mouth daily.  . Coenzyme Q10 (COQ10 PO) Take 1 tablet by mouth daily.  .  Cyanocobalamin (B-12 PO) Take 1 tablet by mouth daily.  . diazepam (VALIUM) 2 MG tablet Take 2 mg by mouth every 12 (twelve) hours as needed for anxiety.   . hydrochlorothiazide (HYDRODIURIL) 12.5 MG tablet Take 12.5 mg by mouth every morning.  . Multiple Vitamin (MULTIVITAMIN PO) Take 1 tablet by mouth daily.  . nitroGLYCERIN (NITROSTAT) 0.4 MG SL tablet Place 0.4 mg under the tongue every 5 (five) minutes as needed for chest pain.   Marland Kitchen oxyCODONE-acetaminophen (PERCOCET/ROXICET) 5-325 MG tablet Take 1 tablet by mouth every 4 (four) hours as needed for pain.  Marland Kitchen PRED FORTE 1 % ophthalmic suspension Place 1 drop into the left eye in the morning, at noon, in the evening, and at bedtime.  1 drop in the right eye 3 times daily  . rosuvastatin (CRESTOR) 10 MG tablet Take 10 mg by mouth at bedtime.  Marland Kitchen scopolamine (TRANSDERM-SCOP) 1 MG/3DAYS Place 1 patch onto the skin 3 (three) times daily as needed for dizziness.     Allergies:   Methotrexate, Methotrexate derivatives, Penicillins, and Ciprofloxacin   Social History   Socioeconomic History  . Marital status: Single    Spouse name: Not on file  . Number of children: Not on file  . Years of education: Not on file  . Highest education level: Not on file  Occupational History  . Not on file  Tobacco Use  . Smoking status: Former Games developer  . Smokeless tobacco: Never Used  Substance and Sexual Activity  . Alcohol use: Yes    Alcohol/week: 2.0 standard drinks    Types: 1 Glasses of wine, 1 Cans of beer per week  . Drug use: No  . Sexual activity: Not on file  Other Topics Concern  . Not on file  Social History Narrative  . Not on file   Social Determinants of Health   Financial Resource Strain: Not on file  Food Insecurity: Not on file  Transportation Needs: Not on file  Physical Activity: Not on file  Stress: Not on file  Social Connections: Not on file     Family History: The patient's family history includes Allergic Disorder in  her brother; Allergic rhinitis in her sister; Asthma in her brother, sister, and sister.  ROS:   Please see the history of present illness.    All other systems reviewed and are negative.  EKGs/Labs/Other Studies Reviewed:    The following studies were reviewed today: I discussed my findings with the patient at length including echocardiogram done in the past   Recent Labs: 07/30/2020: ALT 18 02/26/2021: BUN 12; Creatinine, Ser 1.03; Hemoglobin 12.0; Platelets 219; Potassium 4.7; Sodium 140  Recent Lipid Panel    Component Value Date/Time   CHOL 197 06/09/2008 2014   TRIG 101 06/09/2008 2014   HDL 73 06/09/2008 2014   CHOLHDL 2.7 Ratio 06/09/2008  2014   VLDL 20 06/09/2008 2014   LDLCALC 104 (H) 06/09/2008 2014    Physical Exam:    VS:  BP 126/76   Pulse 70   Ht 5\' 6"  (1.676 m)   Wt 259 lb 3.2 oz (117.6 kg)   SpO2 100%   BMI 41.84 kg/m     Wt Readings from Last 3 Encounters:  05/05/21 259 lb 3.2 oz (117.6 kg)  10/28/20 240 lb (108.9 kg)  07/30/20 240 lb (108.9 kg)     GEN: Patient is in no acute distress HEENT: Normal NECK: No JVD; No carotid bruits LYMPHATICS: No lymphadenopathy CARDIAC: Hear sounds regular, 2/6 systolic murmur at the apex. RESPIRATORY:  Clear to auscultation without rales, wheezing or rhonchi  ABDOMEN: Soft, non-tender, non-distended MUSCULOSKELETAL:  No edema; No deformity  SKIN: Warm and dry NEUROLOGIC:  Alert and oriented x 3 PSYCHIATRIC:  Normal affect   Signed, 09/29/20, MD  05/05/2021 1:39 PM    [facility]

## 2021-05-10 DIAGNOSIS — M87052 Idiopathic aseptic necrosis of left femur: Secondary | ICD-10-CM | POA: Diagnosis not present

## 2021-05-10 DIAGNOSIS — I10 Essential (primary) hypertension: Secondary | ICD-10-CM | POA: Diagnosis not present

## 2021-05-10 DIAGNOSIS — E538 Deficiency of other specified B group vitamins: Secondary | ICD-10-CM | POA: Diagnosis not present

## 2021-05-10 DIAGNOSIS — R202 Paresthesia of skin: Secondary | ICD-10-CM | POA: Diagnosis not present

## 2021-05-10 DIAGNOSIS — Z1231 Encounter for screening mammogram for malignant neoplasm of breast: Secondary | ICD-10-CM | POA: Diagnosis not present

## 2021-05-10 DIAGNOSIS — Z79899 Other long term (current) drug therapy: Secondary | ICD-10-CM | POA: Diagnosis not present

## 2021-05-10 DIAGNOSIS — E559 Vitamin D deficiency, unspecified: Secondary | ICD-10-CM | POA: Diagnosis not present

## 2021-05-10 DIAGNOSIS — E785 Hyperlipidemia, unspecified: Secondary | ICD-10-CM | POA: Diagnosis not present

## 2021-05-31 DIAGNOSIS — J45909 Unspecified asthma, uncomplicated: Secondary | ICD-10-CM | POA: Diagnosis not present

## 2021-06-07 DIAGNOSIS — H547 Unspecified visual loss: Secondary | ICD-10-CM | POA: Diagnosis not present

## 2021-06-07 DIAGNOSIS — E559 Vitamin D deficiency, unspecified: Secondary | ICD-10-CM | POA: Diagnosis not present

## 2021-06-07 DIAGNOSIS — R633 Feeding difficulties, unspecified: Secondary | ICD-10-CM | POA: Diagnosis not present

## 2021-06-07 DIAGNOSIS — E538 Deficiency of other specified B group vitamins: Secondary | ICD-10-CM | POA: Diagnosis not present

## 2021-06-17 DIAGNOSIS — Z1231 Encounter for screening mammogram for malignant neoplasm of breast: Secondary | ICD-10-CM | POA: Diagnosis not present

## 2021-06-17 DIAGNOSIS — Z13820 Encounter for screening for osteoporosis: Secondary | ICD-10-CM | POA: Diagnosis not present

## 2021-07-07 DIAGNOSIS — E559 Vitamin D deficiency, unspecified: Secondary | ICD-10-CM | POA: Diagnosis not present

## 2021-07-07 DIAGNOSIS — R633 Feeding difficulties, unspecified: Secondary | ICD-10-CM | POA: Diagnosis not present

## 2021-07-07 DIAGNOSIS — H547 Unspecified visual loss: Secondary | ICD-10-CM | POA: Diagnosis not present

## 2021-07-07 DIAGNOSIS — E538 Deficiency of other specified B group vitamins: Secondary | ICD-10-CM | POA: Diagnosis not present

## 2021-07-11 ENCOUNTER — Other Ambulatory Visit: Payer: Self-pay | Admitting: Cardiology

## 2021-07-11 NOTE — Telephone Encounter (Signed)
Amlodipine 2.5 mg # 180 x 2 sent to [id number] - [place], [place] - [address] AT [facility]

## 2021-07-15 ENCOUNTER — Other Ambulatory Visit: Payer: Self-pay

## 2021-07-15 ENCOUNTER — Encounter: Payer: Self-pay | Admitting: Sports Medicine

## 2021-07-15 ENCOUNTER — Ambulatory Visit (INDEPENDENT_AMBULATORY_CARE_PROVIDER_SITE_OTHER): Payer: Medicare Other | Admitting: Sports Medicine

## 2021-07-15 DIAGNOSIS — M7661 Achilles tendinitis, right leg: Secondary | ICD-10-CM | POA: Diagnosis not present

## 2021-07-15 DIAGNOSIS — M7660 Achilles tendinitis, unspecified leg: Secondary | ICD-10-CM | POA: Diagnosis not present

## 2021-07-15 DIAGNOSIS — M79671 Pain in right foot: Secondary | ICD-10-CM

## 2021-07-15 DIAGNOSIS — H540X33 Blindness right eye category 3, blindness left eye category 3: Secondary | ICD-10-CM | POA: Diagnosis not present

## 2021-07-15 NOTE — Progress Notes (Signed)
Subjective: Jillian Taylor is a 70 y.o. female patient who returns to office for follow-up evaluation of right heel pain.  Patient reports that pain has flared back up and is also going up the back for [place] in nature reports that she feels best when she wears a wedge shoe cannot wear anything flat because it hurts her leg at the back of the heel and the back of the calf.  Patient also admits to some occasional swelling.  Patient is assisted by her caregiver Diane at this visit.  Patient Active Problem List   Diagnosis Date Noted   Hypertension    Obesity (BMI 35.0-39.9 without comorbidity) 10/28/2020   Arthritis    Asthma    Eczema    Blind 04/15/2020   BPPV (benign paroxysmal positional vertigo), right 04/15/2020   Hearing loss 04/15/2020   Essential hypertension 03/24/2019   Overweight 03/24/2019   Chest discomfort 03/24/2019   Mixed dyslipidemia 03/24/2019   Allergic rhinoconjunctivitis 11/08/2015   Aseptic necrosis of head or neck of femur 12/08/2011   Morbid obesity (HCC) 12/08/2011   ANXIETY DEPRESSION 07/02/2007   UVEITIS 07/02/2007   Mild intermittent asthma 07/02/2007   PULMONARY NODULE 07/02/2007   CONSTIPATION, RECURRENT 07/02/2007   MENOPAUSAL SYNDROME 07/02/2007   ALOPECIA AREATA 07/02/2007   BACK PAIN, LUMBAR, CHRONIC 07/02/2007   LPRD (laryngopharyngeal reflux disease) 07/02/2007   TOBACCO USE, QUIT 07/02/2007    Current Outpatient Medications on File Prior to Visit  Medication Sig Dispense Refill   albuterol (VENTOLIN HFA) 108 (90 Base) MCG/ACT inhaler Inhale 2 puffs into the lungs every 4 (four) hours as needed for wheezing or shortness of breath.     amLODipine (NORVASC) 2.5 MG tablet TAKE 1 TABLET BY MOUTH EVERY DAY 180 tablet 2   cetirizine HCl (ZYRTEC) 1 MG/ML solution Take 10 mg by mouth daily as needed for allergies (allergies).     Cholecalciferol (VITAMIN D PO) Take 1 capsule by mouth daily.     Coenzyme Q10 (COQ10 PO) Take 1 tablet by mouth daily.      Cyanocobalamin (B-12 PO) Take 1 tablet by mouth daily.     diazepam (VALIUM) 2 MG tablet Take 2 mg by mouth every 12 (twelve) hours as needed for anxiety.      hydrochlorothiazide (HYDRODIURIL) 12.5 MG tablet Take 12.5 mg by mouth every morning.     Multiple Vitamin (MULTIVITAMIN PO) Take 1 tablet by mouth daily.     nitroGLYCERIN (NITROSTAT) 0.4 MG SL tablet Place 0.4 mg under the tongue every 5 (five) minutes as needed for chest pain.      oxyCODONE-acetaminophen (PERCOCET/ROXICET) 5-325 MG tablet Take 1 tablet by mouth every 4 (four) hours as needed for pain.     PRED FORTE 1 % ophthalmic suspension Place 1 drop into the left eye in the morning, at noon, in the evening, and at bedtime.  1 drop in the right eye 3 times daily     rosuvastatin (CRESTOR) 10 MG tablet Take 10 mg by mouth at bedtime.     scopolamine (TRANSDERM-SCOP) 1 MG/3DAYS Place 1 patch onto the skin 3 (three) times daily as needed for dizziness.     No current facility-administered medications on file prior to visit.    Allergies  Allergen Reactions   Methotrexate Hives   Methotrexate Derivatives Hives   Penicillins Hives    Has patient had a PCN reaction causing immediate rash, facial/tongue/throat swelling, SOB or lightheadedness with hypotension: Yes Has patient had a PCN reaction  causing severe rash involving mucus membranes or skin necrosis: Yes Has patient had a PCN reaction that required hospitalization No Has patient had a PCN reaction occurring within the last 10 years: No If all of the above answers are "NO", then may proceed with Cephalosporin use.    Ciprofloxacin Rash    Reported by patient     Objective:  General: Alert and oriented x3 in no acute distress but is blind  Dermatology: No open lesions bilateral lower extremities, no webspace macerations, no ecchymosis bilateral, all nails x 10 are mildly elongated.  Vascular: Dorsalis Pedis and Posterior Tibial pedal pulses 1/4, Capillary Fill Time 3  seconds, + pedal hair growth bilateral, focal edema bilateral lower extremities worse at the right posterior heel, Temperature gradient within normal limits.  Neurology: Michaell Cowing sensation intact via light touch bilateral.  Musculoskeletal: Mild tenderness with palpation at insertion of the Achilles on Right, there is also tenderness with palpation at the myotendinous junction of the right calf, there is calcaneal exostosis with mild soft tissue swelling present and decreased ankle rom with knee extending  vs flexed resembling gastroc equnius bilateral, The achilles tendon feels intact with no nodularity or palpable dell, Thompson sign negative.   Assessment and Plan: Problem List Items Addressed This Visit       Other   Blind   Other Visit Diagnoses     Achilles tendinitis, right leg    -  Primary   Relevant Orders   MR ANKLE RIGHT WO CONTRAST   Achilles tendon pain       Relevant Orders   MR ANKLE RIGHT WO CONTRAST   Right foot pain          -Complete exam performed -Re-Discussed treatment options for Achilles tendinitis with now pain up to the level of the muscle -Patient declined prescription for oral steroids at this time -Rx MRI for further evaluation of Achilles tendon to rule out tear at [facility] imaging -Dispensed again heel lifts and recommend patient to continue with gentle stretching and use of night splint as  -Dispensed Surgigrip compression sleeve for patient to use as directed for additional support to the calf and leg and compression for swelling -At no additional charge mechanically debrided nails x10 using a sterile nail nipper without incident -Advised patient if her MRI is negative would benefit from physical therapy -Patient to return to office after MRI or sooner if condition worsens.  Asencion Islam, DPM

## 2021-08-02 ENCOUNTER — Other Ambulatory Visit: Payer: Self-pay

## 2021-08-02 ENCOUNTER — Ambulatory Visit
Admission: RE | Admit: 2021-08-02 | Discharge: 2021-08-02 | Disposition: A | Discharge status: Home / Self Care | Payer: Medicaid Other | Source: Ambulatory Visit | Attending: Sports Medicine | Admitting: Sports Medicine

## 2021-08-02 DIAGNOSIS — M7660 Achilles tendinitis, unspecified leg: Secondary | ICD-10-CM

## 2021-08-02 DIAGNOSIS — M7661 Achilles tendinitis, right leg: Secondary | ICD-10-CM | POA: Diagnosis not present

## 2021-08-02 DIAGNOSIS — R600 Localized edema: Secondary | ICD-10-CM | POA: Diagnosis not present

## 2021-08-05 ENCOUNTER — Telehealth: Payer: Self-pay | Admitting: Sports Medicine

## 2021-08-05 DIAGNOSIS — E559 Vitamin D deficiency, unspecified: Secondary | ICD-10-CM | POA: Diagnosis not present

## 2021-08-05 DIAGNOSIS — H547 Unspecified visual loss: Secondary | ICD-10-CM | POA: Diagnosis not present

## 2021-08-05 DIAGNOSIS — R633 Feeding difficulties, unspecified: Secondary | ICD-10-CM | POA: Diagnosis not present

## 2021-08-05 DIAGNOSIS — E538 Deficiency of other specified B group vitamins: Secondary | ICD-10-CM | POA: Diagnosis not present

## 2021-08-05 NOTE — Telephone Encounter (Signed)
Will do. I will cal her back and Ill have to get amanda or keely to open the spot for me.

## 2021-08-05 NOTE — Telephone Encounter (Signed)
Patient calling to request a call back in regard to MRI results. If she needs to get scheduled to discuss results in office please let me know.

## 2021-08-12 ENCOUNTER — Telehealth (INDEPENDENT_AMBULATORY_CARE_PROVIDER_SITE_OTHER): Payer: Medicare Other | Admitting: Sports Medicine

## 2021-08-12 ENCOUNTER — Encounter: Payer: Self-pay | Admitting: Sports Medicine

## 2021-08-12 DIAGNOSIS — J45909 Unspecified asthma, uncomplicated: Secondary | ICD-10-CM | POA: Diagnosis not present

## 2021-08-12 DIAGNOSIS — M79671 Pain in right foot: Secondary | ICD-10-CM | POA: Diagnosis not present

## 2021-08-12 DIAGNOSIS — M7661 Achilles tendinitis, right leg: Secondary | ICD-10-CM

## 2021-08-12 DIAGNOSIS — H540X33 Blindness right eye category 3, blindness left eye category 3: Secondary | ICD-10-CM | POA: Diagnosis not present

## 2021-08-12 DIAGNOSIS — M7660 Achilles tendinitis, unspecified leg: Secondary | ICD-10-CM

## 2021-08-12 NOTE — Progress Notes (Signed)
Virtual Visit via Telephone Note  I connected with Jillian Taylor on 08/12/21 at  8:00 AM EDT by telephone and verified that I am speaking with the correct person using two identifiers.  Location: Patient: Jillian Taylor, home  Provider: Landis Martins, DPM, [facility] [place] [facility]    I discussed the limitations, risks, security and privacy concerns of performing an evaluation and management service by telephone and the availability of in person appointments. I also discussed with the patient that there may be a patient responsible charge related to this service. The patient expressed understanding and agreed to proceed.   History of Present Illness: 70 y/o F patient met to discuss MRI results. Patient states that she still has pain but her heel feels better when she has a shoe that has a 1 to 2 inch heel.  Patient also reports that she may be starting school and at her school there is a lot of walking and she is not sure if she is able to do it but she will be making a decision in a few months that she will be doing school.  Patient denies any other concerns or pedal complaints at this time.   Observations/Objective: Physical exam unable to be performed due to telephone nature of visit  Assessment and Plan:  Problem List Items Addressed This Visit       Other   Blind   Other Visit Diagnoses     Achilles tendinitis, right leg    -  Primary   Achilles tendon pain       Right foot pain           MRI consistent with peritendinitis and retrocalcaneal bursitis with Haglund's deformity no full-thickness tear  -Discussed MRI results with patient -Recommend at this time physical therapy; Rx sent to [facility] good supportive shoes daily for foot type that does not rub the back of the heel -Continue with rest ice elevation and activities to tolerance  Follow Up Instructions: After physical therapy or sooner if problems or issues arise   I  discussed the assessment and treatment plan with the patient. The patient was provided an opportunity to ask questions and all were answered. The patient agreed with the plan and demonstrated an understanding of the instructions.   The patient was advised to call back or seek an in-person evaluation if the symptoms worsen or if the condition fails to improve as anticipated.  I provided 9 minutes of non-face-to-face time during this encounter.   Landis Martins, DPM

## 2021-08-15 DIAGNOSIS — M25571 Pain in right ankle and joints of right foot: Secondary | ICD-10-CM | POA: Diagnosis not present

## 2021-08-15 DIAGNOSIS — R2689 Other abnormalities of gait and mobility: Secondary | ICD-10-CM | POA: Diagnosis not present

## 2021-08-17 DIAGNOSIS — M25571 Pain in right ankle and joints of right foot: Secondary | ICD-10-CM | POA: Diagnosis not present

## 2021-08-17 DIAGNOSIS — R2689 Other abnormalities of gait and mobility: Secondary | ICD-10-CM | POA: Diagnosis not present

## 2021-08-31 DIAGNOSIS — R2689 Other abnormalities of gait and mobility: Secondary | ICD-10-CM | POA: Diagnosis not present

## 2021-08-31 DIAGNOSIS — M25571 Pain in right ankle and joints of right foot: Secondary | ICD-10-CM | POA: Diagnosis not present

## 2021-09-07 DIAGNOSIS — E538 Deficiency of other specified B group vitamins: Secondary | ICD-10-CM | POA: Diagnosis not present

## 2021-09-07 DIAGNOSIS — E559 Vitamin D deficiency, unspecified: Secondary | ICD-10-CM | POA: Diagnosis not present

## 2021-09-07 DIAGNOSIS — H547 Unspecified visual loss: Secondary | ICD-10-CM | POA: Diagnosis not present

## 2021-09-07 DIAGNOSIS — R633 Feeding difficulties, unspecified: Secondary | ICD-10-CM | POA: Diagnosis not present

## 2021-09-09 DIAGNOSIS — M25571 Pain in right ankle and joints of right foot: Secondary | ICD-10-CM | POA: Diagnosis not present

## 2021-09-09 DIAGNOSIS — R2689 Other abnormalities of gait and mobility: Secondary | ICD-10-CM | POA: Diagnosis not present

## 2021-09-14 DIAGNOSIS — M549 Dorsalgia, unspecified: Secondary | ICD-10-CM | POA: Diagnosis not present

## 2021-09-14 DIAGNOSIS — Z743 Need for continuous supervision: Secondary | ICD-10-CM | POA: Diagnosis not present

## 2021-09-15 ENCOUNTER — Emergency Department (HOSPITAL_COMMUNITY): Payer: Medicare Other

## 2021-09-15 ENCOUNTER — Emergency Department (HOSPITAL_COMMUNITY)
Admission: EM | Admit: 2021-09-15 | Discharge: 2021-09-15 | Disposition: A | Discharge status: Home / Self Care | Payer: Medicare Other | Attending: Emergency Medicine | Admitting: Emergency Medicine

## 2021-09-15 ENCOUNTER — Other Ambulatory Visit: Payer: Self-pay

## 2021-09-15 ENCOUNTER — Encounter (HOSPITAL_COMMUNITY): Payer: Self-pay

## 2021-09-15 DIAGNOSIS — Z79899 Other long term (current) drug therapy: Secondary | ICD-10-CM | POA: Insufficient documentation

## 2021-09-15 DIAGNOSIS — R112 Nausea with vomiting, unspecified: Secondary | ICD-10-CM | POA: Insufficient documentation

## 2021-09-15 DIAGNOSIS — J4520 Mild intermittent asthma, uncomplicated: Secondary | ICD-10-CM | POA: Diagnosis not present

## 2021-09-15 DIAGNOSIS — M545 Low back pain, unspecified: Secondary | ICD-10-CM

## 2021-09-15 DIAGNOSIS — M5459 Other low back pain: Secondary | ICD-10-CM | POA: Diagnosis not present

## 2021-09-15 DIAGNOSIS — R109 Unspecified abdominal pain: Secondary | ICD-10-CM

## 2021-09-15 DIAGNOSIS — I700 Atherosclerosis of aorta: Secondary | ICD-10-CM | POA: Diagnosis not present

## 2021-09-15 DIAGNOSIS — Z87891 Personal history of nicotine dependence: Secondary | ICD-10-CM | POA: Diagnosis not present

## 2021-09-15 DIAGNOSIS — Z9049 Acquired absence of other specified parts of digestive tract: Secondary | ICD-10-CM | POA: Diagnosis not present

## 2021-09-15 DIAGNOSIS — I10 Essential (primary) hypertension: Secondary | ICD-10-CM | POA: Insufficient documentation

## 2021-09-15 LAB — URINALYSIS, ROUTINE W REFLEX MICROSCOPIC
Bacteria, UA: NONE SEEN
Bilirubin Urine: NEGATIVE
Glucose, UA: NEGATIVE mg/dL
Hgb urine dipstick: NEGATIVE
Ketones, ur: NEGATIVE mg/dL
Nitrite: NEGATIVE
Protein, ur: NEGATIVE mg/dL
Specific Gravity, Urine: 1.015 (ref 1.005–1.030)
pH: 6 (ref 5.0–8.0)

## 2021-09-15 LAB — COMPREHENSIVE METABOLIC PANEL
ALT: 22 U/L (ref 0–44)
AST: 30 U/L (ref 15–41)
Albumin: 4.3 g/dL (ref 3.5–5.0)
Alkaline Phosphatase: 83 U/L (ref 38–126)
Anion gap: 11 (ref 5–15)
BUN: 14 mg/dL (ref 8–23)
CO2: 27 mmol/L (ref 22–32)
Calcium: 9.6 mg/dL (ref 8.9–10.3)
Chloride: 100 mmol/L (ref 98–111)
Creatinine, Ser: 0.65 mg/dL (ref 0.44–1.00)
GFR, Estimated: >60 mL/min (ref >60)
Glucose, Bld: 100 mg/dL — ABNORMAL HIGH (ref 70–99)
Potassium: 3.4 mmol/L — ABNORMAL LOW (ref 3.5–5.1)
Sodium: 138 mmol/L (ref 135–145)
Total Bilirubin: 0.9 mg/dL (ref 0.3–1.2)
Total Protein: 8.8 g/dL — ABNORMAL HIGH (ref 6.5–8.1)

## 2021-09-15 LAB — CBC WITH DIFFERENTIAL/PLATELET
Abs Immature Granulocytes: 0.01 10*3/uL (ref 0.00–0.07)
Basophils Absolute: 0 10*3/uL (ref 0.0–0.1)
Basophils Relative: 0 %
Eosinophils Absolute: 0.1 10*3/uL (ref 0.0–0.5)
Eosinophils Relative: 1 %
HCT: 39.8 % (ref 36.0–46.0)
Hemoglobin: 12.3 g/dL (ref 12.0–15.0)
Immature Granulocytes: 0 %
Lymphocytes Relative: 23 %
Lymphs Abs: 2.4 10*3/uL (ref 0.7–4.0)
MCH: 22 pg — ABNORMAL LOW (ref 26.0–34.0)
MCHC: 30.9 g/dL (ref 30.0–36.0)
MCV: 71.2 fL — ABNORMAL LOW (ref 80.0–100.0)
Monocytes Absolute: 0.7 10*3/uL (ref 0.1–1.0)
Monocytes Relative: 7 %
Neutro Abs: 7.1 10*3/uL (ref 1.7–7.7)
Neutrophils Relative %: 69 %
Platelets: 200 10*3/uL (ref 150–400)
RBC: 5.59 MIL/uL — ABNORMAL HIGH (ref 3.87–5.11)
RDW: 15.4 % (ref 11.5–15.5)
WBC: 10.3 10*3/uL (ref 4.0–10.5)
nRBC: 0 % (ref 0.0–0.2)

## 2021-09-15 MED ORDER — SODIUM CHLORIDE 0.9 % IV BOLUS
1000.0000 mL | Freq: Once | INTRAVENOUS | Status: AC
  Administered 2021-09-15: 1000 mL via INTRAVENOUS

## 2021-09-15 MED ORDER — KETOROLAC TROMETHAMINE 30 MG/ML IJ SOLN
30.0000 mg | Freq: Once | INTRAMUSCULAR | Status: AC
  Administered 2021-09-15: 30 mg via INTRAVENOUS
  Filled 2021-09-15: qty 1

## 2021-09-15 MED ORDER — FENTANYL CITRATE PF 50 MCG/ML IJ SOSY
50.0000 ug | PREFILLED_SYRINGE | Freq: Once | INTRAMUSCULAR | Status: AC
  Administered 2021-09-15: 50 ug via INTRAVENOUS
  Filled 2021-09-15: qty 1

## 2021-09-15 MED ORDER — ONDANSETRON HCL 4 MG/2ML IJ SOLN
4.0000 mg | Freq: Once | INTRAMUSCULAR | Status: AC
  Administered 2021-09-15: 4 mg via INTRAVENOUS
  Filled 2021-09-15: qty 2

## 2021-09-15 MED ORDER — OXYCODONE HCL 5 MG PO TABS: 5.0000 mg | ORAL_TABLET | ORAL | 0 refills | Status: DC | PRN

## 2021-09-15 MED ORDER — METHYLPREDNISOLONE 4 MG PO TBPK
ORAL_TABLET | ORAL | 0 refills | Status: DC
Start: 2021-09-15 — End: 2022-01-20

## 2021-09-15 NOTE — ED Triage Notes (Signed)
Pt BIB EMS, Pt complains of left back and flank pain that only occurs on standing and sitting. Pt complains of nausea and vomiting from the pain. Pt states that the pain started yesterday but has gotten worse today.

## 2021-09-15 NOTE — ED Provider Notes (Signed)
  Physical Exam  BP 126/75   Pulse 70   Temp 98.5 F (36.9 C) (Oral)   Resp 15   SpO2 100%   Physical Exam  ED Course/Procedures     Procedures  MDM  Received care of patient from Dr. Manus Gunning.  Please see his note for prior history, physical and care.  Briefly this is a 70 year old female who presents with concern for left flank pain.  CT and urine pending.  CT stone study without acute abnormalities.  Urinalysis shows no sign of infection.  She has good pulses bilaterally, pain is worse with movements, and have low suspicion for aortic dissection or other vascular phenomenon as etiology of her pain.  Suspect most likely musculoskeletal etiology given her tenderness on exam and pain with movement, possible disc herniation.  She reports intolerance to NSAIDs and muscle relaxants, and reports ongoing severe pain. Will give rx for medrol dose pack, short rx for oxycodone after discussion of risks and review in [place] drug database, recommend continuing tylenol, lidocaine patches and PCP follow up.        Alvira Monday, MD 09/15/21 1710

## 2021-09-15 NOTE — ED Provider Notes (Signed)
[facility]-EMERGENCY DEPT Provider Note   CSN: [id number] Arrival date & time: 09/15/21  0005     History Chief Complaint  Patient presents with   Flank Pain   Back Pain    GEARLINE SPILMAN is a 70 y.o. female.  Patient presents with left flank and back pain intermittent for the past week or 2 but persistent over the past 2 days.  Denies any fall or trauma.  Since yesterday the pain has been fairly constant nothing makes it better or worse.  She had 4 episodes of nausea and vomiting.  Denies pain with urination or blood in the urine.  Denies any history of kidney stones.  The pain does not radiate down her leg.  No associated weakness, numbness or tingling.  No bowel or bladder incontinence.  No fever.  No history of cancer or IV drug abuse.  Denies any history of chronic back pain. no chest pain or shortness of breath. Pain radiates to her left abdomen and navel. Had a similar visit in March for flank pain but this is different.  States she is never had pain this severe before.  Has not had any pain medication since last night  The history is provided by the patient.  Flank Pain Associated symptoms include abdominal pain. Pertinent negatives include no headaches and no shortness of breath.  Back Pain Associated symptoms: abdominal pain   Associated symptoms: no dysuria, no fever, no headaches and no weakness       Past Medical History:  Diagnosis Date   Allergic rhinoconjunctivitis 11/08/2015   Alopecia areata 07/02/2007   Qualifier: Diagnosis of  By: Audria Nine MD, Britta Mccreedy     ANXIETY DEPRESSION 07/02/2007   Qualifier: Diagnosis of  By: Audria Nine MD, Britta Mccreedy     Arthritis    Aseptic necrosis of head or neck of femur 12/08/2011   Asthma    BACK PAIN, LUMBAR, CHRONIC 07/02/2007   Annotation: 5 mm retrolisthesis L5 on L4 Qualifier: Diagnosis of  By: Audria Nine MD, Barbara     Blind    BPPV (benign paroxysmal positional vertigo), right 04/15/2020   Chest  discomfort 03/24/2019   CONSTIPATION, RECURRENT 07/02/2007   Qualifier: Diagnosis of  By: Audria Nine MD, Britta Mccreedy     Eczema    Essential hypertension 03/24/2019   Hearing loss 04/15/2020   Hypertension    LPRD (laryngopharyngeal reflux disease) 07/02/2007   Qualifier: Diagnosis of  By: Audria Nine MD, Geri [place] SYNDROME 07/02/2007   Qualifier: Diagnosis of  By: Audria Nine MD, Barbara     Mild intermittent asthma 07/02/2007   Annotation: Possible Reactive airway disease Qualifier: Diagnosis of  By: Audria Nine MD, Barbara     Mixed dyslipidemia 03/24/2019   Morbid obesity (HCC) 12/08/2011   Obesity (BMI 35.0-39.9 without comorbidity) 10/28/2020   Overweight 03/24/2019   PULMONARY NODULE 07/02/2007   Qualifier: History of  By: Audria Nine MD, [place] USE, QUIT 07/02/2007   Qualifier: Diagnosis of  By: Audria Nine MD, Britta Mccreedy     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 Qualifier: Diagnosis of  By: Audria Nine MD, Britta Mccreedy      Patient Active Problem List   Diagnosis Date Noted   Hypertension    Obesity (BMI 35.0-39.9 without comorbidity) 10/28/2020   Arthritis    Asthma    Eczema    Blind 04/15/2020   BPPV (benign paroxysmal positional vertigo), right 04/15/2020   Hearing loss 04/15/2020   Essential hypertension 03/24/2019  Overweight 03/24/2019   Chest discomfort 03/24/2019   Mixed dyslipidemia 03/24/2019   Allergic rhinoconjunctivitis 11/08/2015   Aseptic necrosis of head or neck of femur 12/08/2011   Morbid obesity (HCC) 12/08/2011   ANXIETY DEPRESSION 07/02/2007   UVEITIS 07/02/2007   Mild intermittent asthma 07/02/2007   PULMONARY NODULE 07/02/2007   CONSTIPATION, RECURRENT 07/02/2007   MENOPAUSAL SYNDROME 07/02/2007   ALOPECIA AREATA 07/02/2007   BACK PAIN, LUMBAR, CHRONIC 07/02/2007   LPRD (laryngopharyngeal reflux disease) 07/02/2007   TOBACCO USE, QUIT 07/02/2007    Past Surgical History:  Procedure Laterality Date   CHOLECYSTECTOMY      EYE SURGERY     LUNG BIOPSY     TONSILLECTOMY       OB History   No obstetric history on file.     Family History  Problem Relation Age of Onset   Asthma Sister    Allergic rhinitis Sister    Asthma Brother    Allergic Disorder Brother    Asthma Sister     Social History   Tobacco Use   Smoking status: Former   Smokeless tobacco: Never  Substance Use Topics   Alcohol use: Yes    Alcohol/week: 2.0 standard drinks    Types: 1 Glasses of wine, 1 Cans of beer per week   Drug use: No    Home Medications Prior to Admission medications   Medication Sig Start Date End Date Taking? Authorizing Provider  albuterol (VENTOLIN HFA) 108 (90 Base) MCG/ACT inhaler Inhale 2 puffs into the lungs every 4 (four) hours as needed for wheezing or shortness of breath.    [provider]  amLODipine (NORVASC) 2.5 MG tablet TAKE 1 TABLET BY MOUTH EVERY DAY 07/11/21   Revankar, Aundra Dubin, MD  cetirizine HCl (ZYRTEC) 1 MG/ML solution Take 10 mg by mouth daily as needed for allergies (allergies). 06/12/20   [provider]  Cholecalciferol (VITAMIN D PO) Take 1 capsule by mouth daily.    [provider]  Coenzyme Q10 (COQ10 PO) Take 1 tablet by mouth daily.    [provider]  Cyanocobalamin (B-12 PO) Take 1 tablet by mouth daily.    [provider]  diazepam (VALIUM) 2 MG tablet Take 2 mg by mouth every 12 (twelve) hours as needed for anxiety.  08/29/16   [provider]  hydrochlorothiazide (HYDRODIURIL) 12.5 MG tablet Take 12.5 mg by mouth every morning. 07/10/20   [provider]  Multiple Vitamin (MULTIVITAMIN PO) Take 1 tablet by mouth daily.    [provider]  nitroGLYCERIN (NITROSTAT) 0.4 MG SL tablet Place 0.4 mg under the tongue every 5 (five) minutes as needed for chest pain.  02/22/20   [provider]  oxyCODONE-acetaminophen (PERCOCET/ROXICET) 5-325 MG tablet Take 1 tablet by mouth every 4 (four) hours as needed  for pain.    [provider]  PRED FORTE 1 % ophthalmic suspension Place 1 drop into the left eye in the morning, at noon, in the evening, and at bedtime.  1 drop in the right eye 3 times daily 07/17/16   [provider]  rosuvastatin (CRESTOR) 10 MG tablet Take 10 mg by mouth at bedtime. 11/14/20   [provider]  scopolamine (TRANSDERM-SCOP) 1 MG/3DAYS Place 1 patch onto the skin 3 (three) times daily as needed for dizziness. 01/27/21   [provider]    Allergies    Methotrexate, Methotrexate derivatives, Penicillins, and Ciprofloxacin  Review of Systems   Review of Systems  Constitutional:  Negative for activity change, appetite change and fever.  Respiratory:  Negative for cough, chest tightness and shortness of breath.   Gastrointestinal:  Positive for abdominal pain, nausea and vomiting.  Genitourinary:  Positive for flank pain. Negative for dysuria, hematuria, vaginal bleeding and vaginal discharge.  Musculoskeletal:  Positive for back pain.  Skin:  Negative for rash.  Neurological:  Negative for dizziness, weakness and headaches.   all other systems are negative except as noted in the HPI and PMH.   Physical Exam Updated Vital Signs BP (!) 145/119   Pulse 64   Temp 98.5 F (36.9 C) (Oral)   Resp 20   SpO2 98%   Physical Exam Vitals and nursing note reviewed.  Constitutional:      General: She is not in acute distress.    Appearance: She is well-developed.  HENT:     Head: Normocephalic and atraumatic.     Mouth/Throat:     Pharynx: No oropharyngeal exudate.  Eyes:     Conjunctiva/sclera: Conjunctivae normal.     Pupils: Pupils are equal, round, and reactive to light.  Neck:     Comments: No meningismus. Cardiovascular:     Rate and Rhythm: Normal rate and regular rhythm.     Heart sounds: Normal heart sounds. No murmur heard. Pulmonary:     Effort: Pulmonary effort is normal. No respiratory distress.     Breath sounds: Normal  breath sounds.  Abdominal:     Palpations: Abdomen is soft.     Tenderness: There is no abdominal tenderness. There is no guarding or rebound.  Musculoskeletal:        General: Tenderness present. Normal range of motion.     Cervical back: Normal range of motion and neck supple.     Comments: L paraspinal lumbar pain.  5/5 strength in bilateral lower extremities. Ankle plantar and dorsiflexion intact. Great toe extension intact bilaterally. +2 DP and PT pulses.  Skin:    General: Skin is warm.  Neurological:     Mental Status: She is alert and oriented to person, place, and time.     Cranial Nerves: No cranial nerve deficit.     Motor: No abnormal muscle tone.     Coordination: Coordination normal.     Comments:  5/5 strength throughout. CN 2-12 intact.Equal grip strength.   Psychiatric:        Behavior: Behavior normal.    ED Results / Procedures / Treatments   Labs (all labs ordered are listed, but only abnormal results are displayed) Labs Reviewed  CBC WITH DIFFERENTIAL/PLATELET - Abnormal; Notable for the following components:      Result Value   RBC 5.59 (*)    MCV 71.2 (*)    MCH 22.0 (*)    All other components within normal limits  COMPREHENSIVE METABOLIC PANEL - Abnormal; Notable for the following components:   Potassium 3.4 (*)    Glucose, Bld 100 (*)    Total Protein 8.8 (*)    All other components within normal limits  URINALYSIS, ROUTINE W REFLEX MICROSCOPIC    EKG None  Radiology No results found.  Procedures Procedures   Medications Ordered in ED Medications  fentaNYL (SUBLIMAZE) injection 50 mcg (has no administration in time range)  ondansetron (ZOFRAN) injection 4 mg (has no administration in time range)  sodium chloride 0.9 % bolus 1,000 mL (has no administration in time range)    ED Course  I have reviewed the triage vital signs and  the nursing notes.  Pertinent labs & imaging results that were available during my care of the patient were  reviewed by me and considered in my medical decision making (see chart for details).    MDM Rules/Calculators/A&P                           L flank pain worsening over the past 2 days with nausea and vomiting. No fever.  Neurologically intact.  Presentation concerning for kidney stone. Urinalysis pending.  Labs are reassuring.  CT renal study will be obtained.  She has good strength, sensation, pulses in her feet distally.  Low suspicion for cord compression, cauda equina, pleural abscess.  Care to Be transferred at shift change Final Clinical Impression(s) / ED Diagnoses Final diagnoses:  None    Rx / DC Orders ED Discharge Orders     None        Treyshawn Muldrew, Jeannett Senior, MD 09/15/21 [phone]

## 2021-09-16 DIAGNOSIS — R2689 Other abnormalities of gait and mobility: Secondary | ICD-10-CM | POA: Diagnosis not present

## 2021-09-16 DIAGNOSIS — M25571 Pain in right ankle and joints of right foot: Secondary | ICD-10-CM | POA: Diagnosis not present

## 2021-09-23 DIAGNOSIS — R2689 Other abnormalities of gait and mobility: Secondary | ICD-10-CM | POA: Diagnosis not present

## 2021-09-23 DIAGNOSIS — M25571 Pain in right ankle and joints of right foot: Secondary | ICD-10-CM | POA: Diagnosis not present

## 2021-09-26 DIAGNOSIS — L219 Seborrheic dermatitis, unspecified: Secondary | ICD-10-CM | POA: Diagnosis not present

## 2021-09-28 DIAGNOSIS — M25571 Pain in right ankle and joints of right foot: Secondary | ICD-10-CM | POA: Diagnosis not present

## 2021-09-28 DIAGNOSIS — R2689 Other abnormalities of gait and mobility: Secondary | ICD-10-CM | POA: Diagnosis not present

## 2021-09-30 DIAGNOSIS — R2689 Other abnormalities of gait and mobility: Secondary | ICD-10-CM | POA: Diagnosis not present

## 2021-09-30 DIAGNOSIS — M25571 Pain in right ankle and joints of right foot: Secondary | ICD-10-CM | POA: Diagnosis not present

## 2021-10-05 DIAGNOSIS — R2689 Other abnormalities of gait and mobility: Secondary | ICD-10-CM | POA: Diagnosis not present

## 2021-10-05 DIAGNOSIS — M25571 Pain in right ankle and joints of right foot: Secondary | ICD-10-CM | POA: Diagnosis not present

## 2021-10-06 DIAGNOSIS — R633 Feeding difficulties, unspecified: Secondary | ICD-10-CM | POA: Diagnosis not present

## 2021-10-06 DIAGNOSIS — E538 Deficiency of other specified B group vitamins: Secondary | ICD-10-CM | POA: Diagnosis not present

## 2021-10-06 DIAGNOSIS — H547 Unspecified visual loss: Secondary | ICD-10-CM | POA: Diagnosis not present

## 2021-10-06 DIAGNOSIS — E559 Vitamin D deficiency, unspecified: Secondary | ICD-10-CM | POA: Diagnosis not present

## 2021-10-06 DIAGNOSIS — H20823 Vogt-Koyanagi syndrome, bilateral: Secondary | ICD-10-CM | POA: Diagnosis not present

## 2021-10-14 ENCOUNTER — Ambulatory Visit (INDEPENDENT_AMBULATORY_CARE_PROVIDER_SITE_OTHER): Payer: Medicare Other | Admitting: Sports Medicine

## 2021-10-14 ENCOUNTER — Encounter: Payer: Self-pay | Admitting: Sports Medicine

## 2021-10-14 ENCOUNTER — Other Ambulatory Visit: Payer: Self-pay

## 2021-10-14 DIAGNOSIS — M79675 Pain in left toe(s): Secondary | ICD-10-CM | POA: Diagnosis not present

## 2021-10-14 DIAGNOSIS — M79674 Pain in right toe(s): Secondary | ICD-10-CM

## 2021-10-14 DIAGNOSIS — B351 Tinea unguium: Secondary | ICD-10-CM | POA: Diagnosis not present

## 2021-10-14 DIAGNOSIS — R2689 Other abnormalities of gait and mobility: Secondary | ICD-10-CM | POA: Diagnosis not present

## 2021-10-14 DIAGNOSIS — H540X33 Blindness right eye category 3, blindness left eye category 3: Secondary | ICD-10-CM

## 2021-10-14 DIAGNOSIS — M25571 Pain in right ankle and joints of right foot: Secondary | ICD-10-CM | POA: Diagnosis not present

## 2021-10-14 NOTE — Progress Notes (Signed)
Subjective: Jillian Taylor is a 70 y.o. female patient seen today in office with complaint of mildly painful thickened and elongated toenails; unable to trim. Patient reports that right Achilles is doing better still doing PT with improvement.  Patient denies any other pedal complaints at this time.  Patient is assisted by caregiver and friend.  Patient is blind.  Patient Active Problem List   Diagnosis Date Noted   Hypertension    Obesity (BMI 35.0-39.9 without comorbidity) 10/28/2020   Arthritis    Asthma    Eczema    Blind 04/15/2020   BPPV (benign paroxysmal positional vertigo), right 04/15/2020   Hearing loss 04/15/2020   Essential hypertension 03/24/2019   Overweight 03/24/2019   Chest discomfort 03/24/2019   Mixed dyslipidemia 03/24/2019   Allergic rhinoconjunctivitis 11/08/2015   Aseptic necrosis of head or neck of femur 12/08/2011   Morbid obesity (HCC) 12/08/2011   ANXIETY DEPRESSION 07/02/2007   UVEITIS 07/02/2007   Mild intermittent asthma 07/02/2007   PULMONARY NODULE 07/02/2007   CONSTIPATION, RECURRENT 07/02/2007   MENOPAUSAL SYNDROME 07/02/2007   ALOPECIA AREATA 07/02/2007   BACK PAIN, LUMBAR, CHRONIC 07/02/2007   LPRD (laryngopharyngeal reflux disease) 07/02/2007   TOBACCO USE, QUIT 07/02/2007    Current Outpatient Medications on File Prior to Visit  Medication Sig Dispense Refill   albuterol (VENTOLIN HFA) 108 (90 Base) MCG/ACT inhaler Inhale 2 puffs into the lungs every 4 (four) hours as needed for wheezing or shortness of breath.     amLODipine (NORVASC) 2.5 MG tablet TAKE 1 TABLET BY MOUTH EVERY DAY 180 tablet 2   cetirizine HCl (ZYRTEC) 1 MG/ML solution Take 10 mg by mouth daily as needed for allergies (allergies).     Cholecalciferol (VITAMIN D PO) Take 1 capsule by mouth daily.     Coenzyme Q10 (COQ10 PO) Take 1 tablet by mouth daily.     Cyanocobalamin (B-12 PO) Take 1 tablet by mouth daily.     diazepam (VALIUM) 2 MG tablet Take 2 mg by mouth  every 12 (twelve) hours as needed for anxiety.      hydrochlorothiazide (HYDRODIURIL) 12.5 MG tablet Take 12.5 mg by mouth every morning.     methylPREDNISolone (MEDROL DOSEPAK) 4 MG TBPK tablet See instructions 21 each 0   Multiple Vitamin (MULTIVITAMIN PO) Take 1 tablet by mouth daily.     nitroGLYCERIN (NITROSTAT) 0.4 MG SL tablet Place 0.4 mg under the tongue every 5 (five) minutes as needed for chest pain.      oxyCODONE (ROXICODONE) 5 MG immediate release tablet Take 1 tablet (5 mg total) by mouth every 4 (four) hours as needed for severe pain. 12 tablet 0   oxyCODONE-acetaminophen (PERCOCET/ROXICET) 5-325 MG tablet Take 1 tablet by mouth every 4 (four) hours as needed for pain.     PRED FORTE 1 % ophthalmic suspension Place 1 drop into the left eye in the morning, at noon, in the evening, and at bedtime.  1 drop in the right eye 3 times daily     rosuvastatin (CRESTOR) 10 MG tablet Take 10 mg by mouth at bedtime.     scopolamine (TRANSDERM-SCOP) 1 MG/3DAYS Place 1 patch onto the skin 3 (three) times daily as needed for dizziness.     No current facility-administered medications on file prior to visit.    Allergies  Allergen Reactions   Methotrexate Hives   Methotrexate Derivatives Hives   Penicillins Hives    Has patient had a PCN reaction causing immediate rash, facial/tongue/throat swelling,  SOB or lightheadedness with hypotension: Yes Has patient had a PCN reaction causing severe rash involving mucus membranes or skin necrosis: Yes Has patient had a PCN reaction that required hospitalization No Has patient had a PCN reaction occurring within the last 10 years: No If all of the above answers are "NO", then may proceed with Cephalosporin use.    Ciprofloxacin Rash    Reported by patient     Objective: Physical Exam  General: Well developed, nourished, no acute distress, awake, alert and oriented x 3  Vascular: Dorsalis pedis artery 2/4 bilateral, Posterior tibial artery 1/4  bilateral, skin temperature warm to warm proximal to distal bilateral lower extremities, no varicosities, pedal hair present bilateral.  Neurological: Gross sensation present via light touch bilateral.   Dermatological: Skin is warm, dry, and supple bilateral, Nails 1-10 are tender, long, thick, and discolored with mild subungal debris, no webspace macerations present bilateral, no open lesions present bilateral, no callus/corns/hyperkeratotic tissue present bilateral. No signs of infection bilateral.  Musculoskeletal: No symptomatic boney deformities noted bilateral.  No reproducible pain to the right Achilles insertion. Muscular strength within normal limits without painon range of motion. No pain with calf compression bilateral.  Assessment and Plan:  Problem List Items Addressed This Visit       Other   Blind   Other Visit Diagnoses     Pain due to onychomycosis of toenails of both feet    -  Primary       -Examined patient.  -Discussed treatment options for painful mycotic nails. -Mechanically debrided and reduced mycotic nails with sterile nail nipper and dremel nail file without incident. -Dispensed heel lifts for patient to use as directed -Continue with physical therapy for Achilles on right -Patient to return in 3 months for follow up evaluation or sooner if symptoms worsen.  Asencion Islam, DPM

## 2021-10-18 DIAGNOSIS — J45909 Unspecified asthma, uncomplicated: Secondary | ICD-10-CM | POA: Diagnosis not present

## 2021-10-19 DIAGNOSIS — M25571 Pain in right ankle and joints of right foot: Secondary | ICD-10-CM | POA: Diagnosis not present

## 2021-10-19 DIAGNOSIS — R2689 Other abnormalities of gait and mobility: Secondary | ICD-10-CM | POA: Diagnosis not present

## 2021-10-21 DIAGNOSIS — R2689 Other abnormalities of gait and mobility: Secondary | ICD-10-CM | POA: Diagnosis not present

## 2021-10-21 DIAGNOSIS — M25571 Pain in right ankle and joints of right foot: Secondary | ICD-10-CM | POA: Diagnosis not present

## 2021-10-25 ENCOUNTER — Other Ambulatory Visit: Payer: Self-pay

## 2021-10-26 DIAGNOSIS — M25571 Pain in right ankle and joints of right foot: Secondary | ICD-10-CM | POA: Diagnosis not present

## 2021-10-26 DIAGNOSIS — R2689 Other abnormalities of gait and mobility: Secondary | ICD-10-CM | POA: Diagnosis not present

## 2021-10-28 DIAGNOSIS — R2689 Other abnormalities of gait and mobility: Secondary | ICD-10-CM | POA: Diagnosis not present

## 2021-10-28 DIAGNOSIS — M25571 Pain in right ankle and joints of right foot: Secondary | ICD-10-CM | POA: Diagnosis not present

## 2021-11-01 ENCOUNTER — Ambulatory Visit: Payer: Medicaid Other | Admitting: Cardiology

## 2021-11-07 DIAGNOSIS — H547 Unspecified visual loss: Secondary | ICD-10-CM | POA: Diagnosis not present

## 2021-11-07 DIAGNOSIS — E538 Deficiency of other specified B group vitamins: Secondary | ICD-10-CM | POA: Diagnosis not present

## 2021-11-07 DIAGNOSIS — E559 Vitamin D deficiency, unspecified: Secondary | ICD-10-CM | POA: Diagnosis not present

## 2021-11-07 DIAGNOSIS — R633 Feeding difficulties, unspecified: Secondary | ICD-10-CM | POA: Diagnosis not present

## 2021-11-09 DIAGNOSIS — R2689 Other abnormalities of gait and mobility: Secondary | ICD-10-CM | POA: Diagnosis not present

## 2021-11-09 DIAGNOSIS — M25571 Pain in right ankle and joints of right foot: Secondary | ICD-10-CM | POA: Diagnosis not present

## 2021-11-16 DIAGNOSIS — R2689 Other abnormalities of gait and mobility: Secondary | ICD-10-CM | POA: Diagnosis not present

## 2021-11-16 DIAGNOSIS — M25571 Pain in right ankle and joints of right foot: Secondary | ICD-10-CM | POA: Diagnosis not present

## 2021-11-18 DIAGNOSIS — M25571 Pain in right ankle and joints of right foot: Secondary | ICD-10-CM | POA: Diagnosis not present

## 2021-11-18 DIAGNOSIS — R2689 Other abnormalities of gait and mobility: Secondary | ICD-10-CM | POA: Diagnosis not present

## 2021-12-07 DIAGNOSIS — E538 Deficiency of other specified B group vitamins: Secondary | ICD-10-CM | POA: Diagnosis not present

## 2021-12-07 DIAGNOSIS — R633 Feeding difficulties, unspecified: Secondary | ICD-10-CM | POA: Diagnosis not present

## 2021-12-07 DIAGNOSIS — E559 Vitamin D deficiency, unspecified: Secondary | ICD-10-CM | POA: Diagnosis not present

## 2021-12-07 DIAGNOSIS — H547 Unspecified visual loss: Secondary | ICD-10-CM | POA: Diagnosis not present

## 2021-12-22 DIAGNOSIS — I700 Atherosclerosis of aorta: Secondary | ICD-10-CM | POA: Diagnosis not present

## 2021-12-22 DIAGNOSIS — Z139 Encounter for screening, unspecified: Secondary | ICD-10-CM | POA: Diagnosis not present

## 2021-12-22 DIAGNOSIS — E785 Hyperlipidemia, unspecified: Secondary | ICD-10-CM | POA: Diagnosis not present

## 2021-12-22 DIAGNOSIS — M7918 Myalgia, other site: Secondary | ICD-10-CM | POA: Diagnosis not present

## 2021-12-22 DIAGNOSIS — I10 Essential (primary) hypertension: Secondary | ICD-10-CM | POA: Diagnosis not present

## 2021-12-22 DIAGNOSIS — Z79899 Other long term (current) drug therapy: Secondary | ICD-10-CM | POA: Diagnosis not present

## 2021-12-22 DIAGNOSIS — R3915 Urgency of urination: Secondary | ICD-10-CM | POA: Diagnosis not present

## 2021-12-30 ENCOUNTER — Ambulatory Visit (INDEPENDENT_AMBULATORY_CARE_PROVIDER_SITE_OTHER): Payer: Medicare Other | Admitting: Sports Medicine

## 2021-12-30 ENCOUNTER — Encounter: Payer: Self-pay | Admitting: Sports Medicine

## 2021-12-30 DIAGNOSIS — B351 Tinea unguium: Secondary | ICD-10-CM | POA: Diagnosis not present

## 2021-12-30 DIAGNOSIS — M79674 Pain in right toe(s): Secondary | ICD-10-CM

## 2021-12-30 DIAGNOSIS — H540X33 Blindness right eye category 3, blindness left eye category 3: Secondary | ICD-10-CM

## 2021-12-30 DIAGNOSIS — M79675 Pain in left toe(s): Secondary | ICD-10-CM

## 2021-12-30 DIAGNOSIS — M7661 Achilles tendinitis, right leg: Secondary | ICD-10-CM

## 2021-12-30 NOTE — Progress Notes (Signed)
Subjective: Jillian Taylor is a 71 y.o. female patient seen today in office with complaint of mildly painful thickened and elongated toenails; unable to trim. Patient reports that right Achilles is doing better has finished physical therapy and heel lifts help.  Patient denies any other pedal complaints at this time.  Patient is assisted by caregiver and friend this visit.  Patient is blind.  Patient Active Problem List   Diagnosis Date Noted   Hypertension    Obesity (BMI 35.0-39.9 without comorbidity) 10/28/2020   Arthritis    Asthma    Eczema    Blind 04/15/2020   BPPV (benign paroxysmal positional vertigo), right 04/15/2020   Hearing loss 04/15/2020   Essential hypertension 03/24/2019   Overweight 03/24/2019   Chest discomfort 03/24/2019   Mixed dyslipidemia 03/24/2019   Allergic rhinoconjunctivitis 11/08/2015   Aseptic necrosis of head or neck of femur 12/08/2011   Morbid obesity (HCC) 12/08/2011   ANXIETY DEPRESSION 07/02/2007   UVEITIS 07/02/2007   Mild intermittent asthma 07/02/2007   PULMONARY NODULE 07/02/2007   CONSTIPATION, RECURRENT 07/02/2007   MENOPAUSAL SYNDROME 07/02/2007   ALOPECIA AREATA 07/02/2007   BACK PAIN, LUMBAR, CHRONIC 07/02/2007   LPRD (laryngopharyngeal reflux disease) 07/02/2007   TOBACCO USE, QUIT 07/02/2007    Current Outpatient Medications on File Prior to Visit  Medication Sig Dispense Refill   albuterol (VENTOLIN HFA) 108 (90 Base) MCG/ACT inhaler Inhale 2 puffs into the lungs every 4 (four) hours as needed for wheezing or shortness of breath.     amLODipine (NORVASC) 2.5 MG tablet TAKE 1 TABLET BY MOUTH EVERY DAY 180 tablet 2   cetirizine HCl (ZYRTEC) 1 MG/ML solution Take 10 mg by mouth daily as needed for allergies (allergies).     chlorhexidine (PERIDEX) 0.12 % solution SMARTSIG:0.5 Ounce(s) By Mouth Twice Daily     Cholecalciferol (VITAMIN D PO) Take 1 capsule by mouth daily.     Coenzyme Q10 (COQ10 PO) Take 1 tablet by mouth daily.      Cyanocobalamin (B-12 PO) Take 1 tablet by mouth daily.     diazepam (VALIUM) 2 MG tablet Take 2 mg by mouth every 12 (twelve) hours as needed for anxiety.      hydrochlorothiazide (HYDRODIURIL) 12.5 MG tablet Take 12.5 mg by mouth every morning.     ketoconazole (NIZORAL) 2 % shampoo Apply 1 application topically daily.     methylPREDNISolone (MEDROL DOSEPAK) 4 MG TBPK tablet See instructions 21 each 0   Multiple Vitamin (MULTIVITAMIN PO) Take 1 tablet by mouth daily.     nitroGLYCERIN (NITROSTAT) 0.4 MG SL tablet Place 0.4 mg under the tongue every 5 (five) minutes as needed for chest pain.      oxyCODONE (ROXICODONE) 5 MG immediate release tablet Take 1 tablet (5 mg total) by mouth every 4 (four) hours as needed for severe pain. 12 tablet 0   oxyCODONE-acetaminophen (PERCOCET/ROXICET) 5-325 MG tablet Take 1 tablet by mouth every 4 (four) hours as needed for pain.     PRED FORTE 1 % ophthalmic suspension Place 1 drop into the left eye in the morning, at noon, in the evening, and at bedtime.  1 drop in the right eye 3 times daily     rosuvastatin (CRESTOR) 10 MG tablet Take 10 mg by mouth at bedtime.     scopolamine (TRANSDERM-SCOP) 1 MG/3DAYS Place 1 patch onto the skin 3 (three) times daily as needed for dizziness.     No current facility-administered medications on file prior to visit.  Allergies  Allergen Reactions   Methotrexate Hives   Methotrexate Derivatives Hives   Penicillins Hives    Has patient had a PCN reaction causing immediate rash, facial/tongue/throat swelling, SOB or lightheadedness with hypotension: Yes Has patient had a PCN reaction causing severe rash involving mucus membranes or skin necrosis: Yes Has patient had a PCN reaction that required hospitalization No Has patient had a PCN reaction occurring within the last 10 years: No If all of the above answers are "NO", then may proceed with Cephalosporin use.    Ciprofloxacin Rash    Reported by patient      Objective: Physical Exam  General: Well developed, nourished, no acute distress, awake, alert and oriented x 3  Vascular: Dorsalis pedis artery 2/4 bilateral, Posterior tibial artery 1/4 bilateral, skin temperature warm to warm proximal to distal bilateral lower extremities, no varicosities, pedal hair present bilateral.  Neurological: Gross sensation present via light touch bilateral.   Dermatological: Skin is warm, dry, and supple bilateral, Nails 1-10 are tender, long, thick, and discolored with mild subungal debris, no webspace macerations present bilateral, no open lesions present bilateral, no callus/corns/hyperkeratotic tissue present bilateral. No signs of infection bilateral.  Musculoskeletal: No symptomatic boney deformities noted bilateral.  Minimal reproducible pain to the right Achilles insertion. Muscular strength within normal limits without painon range of motion. No pain with calf compression bilateral.  Assessment and Plan:  Problem List Items Addressed This Visit       Other   Blind   Other Visit Diagnoses     Pain due to onychomycosis of toenails of both feet    -  Primary   Achilles tendinitis, right leg           -Examined patient.  -Discussed treatment options for painful mycotic nails. -Mechanically debrided and reduced mycotic nails with sterile nail nipper and dremel nail file without incident. -Dispensed new heel lifts for patient to use as directed -Continue with physical therapy exercises for Achilles on right -Patient to return in 3 months for follow up evaluation or sooner if symptoms worsen.  Asencion Islam, DPM

## 2022-01-05 DIAGNOSIS — H547 Unspecified visual loss: Secondary | ICD-10-CM | POA: Diagnosis not present

## 2022-01-05 DIAGNOSIS — E559 Vitamin D deficiency, unspecified: Secondary | ICD-10-CM | POA: Diagnosis not present

## 2022-01-05 DIAGNOSIS — E538 Deficiency of other specified B group vitamins: Secondary | ICD-10-CM | POA: Diagnosis not present

## 2022-01-05 DIAGNOSIS — R633 Feeding difficulties, unspecified: Secondary | ICD-10-CM | POA: Diagnosis not present

## 2022-01-20 ENCOUNTER — Other Ambulatory Visit: Payer: Self-pay

## 2022-01-20 ENCOUNTER — Encounter: Payer: Self-pay | Admitting: Cardiology

## 2022-01-20 ENCOUNTER — Ambulatory Visit (INDEPENDENT_AMBULATORY_CARE_PROVIDER_SITE_OTHER): Payer: Medicare Other | Admitting: Cardiology

## 2022-01-20 VITALS — BP 154/78 | HR 77 | Ht 66.0 in | Wt 257.0 lb

## 2022-01-20 DIAGNOSIS — E782 Mixed hyperlipidemia: Secondary | ICD-10-CM

## 2022-01-20 DIAGNOSIS — I10 Essential (primary) hypertension: Secondary | ICD-10-CM

## 2022-01-20 NOTE — Progress Notes (Signed)
Cardiology Office Note:    Date:  01/20/2022   ID:  Jillian Taylor, DOB October 25, 1951, MRN [id number]  PCP:  Leonides Sake, MD  Cardiologist:  Jenean Lindau, MD   Referring MD: Leonides Sake, MD    ASSESSMENT:    1. Essential hypertension   2. Morbid obesity ([place])   3. Mixed dyslipidemia    PLAN:    In order of problems listed above:  Primary prevention stressed with the patient.  Importance of compliance with diet medication stressed and she vocalized understanding. Essential hypertension: Blood pressure stable and diet was emphasized.  Lifestyle modification urged.  She has an element of whitecoat hypertension. Mixed dyslipidemia: Lipids were reviewed and they are fine and I discussed this with her. Morbid obesity: Weight reduction stressed risks of obesity explained and she promises to do better. Patient will be seen in follow-up appointment in 9 months or earlier if the patient has any concerns    Medication Adjustments/Labs and Tests Ordered: Current medicines are reviewed at length with the patient today.  Concerns regarding medicines are outlined above.  No orders of the defined types were placed in this encounter.  No orders of the defined types were placed in this encounter.    No chief complaint on file.    History of Present Illness:    Jillian Taylor is a 71 y.o. female.  Patient has past medical history of essential hypertension, mixed dyslipidemia and morbid obesity.  She denies any problems at this time.  She is visually impaired.  No chest pain orthopnea or PND.  She tells me that her blood pressure at school is 120/70 and she has checked it multiple times.  At the time of my evaluation, the patient is alert awake oriented and in no distress.  Past Medical History:  Diagnosis Date   Allergic rhinoconjunctivitis 11/08/2015   Alopecia areata 07/02/2007   Qualifier: Diagnosis of  By: Jillian Quan MD, Jillian Taylor     ANXIETY DEPRESSION 07/02/2007    Qualifier: Diagnosis of  By: Jillian Quan MD, Jillian Taylor     Arthritis    Aseptic necrosis of head or neck of femur 12/08/2011   Asthma    BACK PAIN, LUMBAR, CHRONIC 07/02/2007   Annotation: 5 mm retrolisthesis L5 on L4 Qualifier: Diagnosis of  By: Jillian Quan MD, Jillian Taylor     Blind    BPPV (benign paroxysmal positional vertigo), right 04/15/2020   Chest discomfort 03/24/2019   CONSTIPATION, RECURRENT 07/02/2007   Qualifier: Diagnosis of  By: Jillian Quan MD, Jillian Taylor     Eczema    Essential hypertension 03/24/2019   Hearing loss 04/15/2020   Hypertension    LPRD (laryngopharyngeal reflux disease) 07/02/2007   Qualifier: Diagnosis of  By: Jillian Quan MD, Berdie Ogren SYNDROME 07/02/2007   Qualifier: Diagnosis of  By: Jillian Quan MD, Jillian Taylor     Mild intermittent asthma 07/02/2007   Annotation: Possible Reactive airway disease Qualifier: Diagnosis of  By: Jillian Quan MD, Jillian Taylor     Mixed dyslipidemia 03/24/2019   Morbid obesity ([place]) 12/08/2011   Obesity (BMI 35.0-39.9 without comorbidity) 10/28/2020   Overweight 03/24/2019   PULMONARY NODULE 07/02/2007   Qualifier: History of  By: Jillian Quan MD, Jillian Taylor USE, QUIT 07/02/2007   Qualifier: Diagnosis of  By: Jillian Quan MD, Jillian Taylor 07/02/2007   Annotation: Hogt-Koyanagi-Harada Syndrome Qualifier: Diagnosis of  By: Jillian Quan MD, Jillian Taylor      Past Surgical History:  Procedure Laterality Date  CHOLECYSTECTOMY     EYE SURGERY     LUNG BIOPSY     TONSILLECTOMY      Current Medications: Current Meds  Medication Sig   albuterol (VENTOLIN HFA) 108 (90 Base) MCG/ACT inhaler Inhale 2 puffs into the lungs every 4 (four) hours as needed for wheezing or shortness of breath.   amLODipine (NORVASC) 2.5 MG tablet TAKE 1 TABLET BY MOUTH EVERY DAY   cetirizine HCl (ZYRTEC) 1 MG/ML solution Take 10 mg by mouth daily as needed for allergies (allergies).   chlorhexidine (PERIDEX) 0.12 % solution SMARTSIG:0.5 Ounce(s) By Mouth Twice Daily    Cholecalciferol (VITAMIN D PO) Take 1 capsule by mouth daily.   Coenzyme Q10 (COQ10 PO) Take 1 tablet by mouth daily.   Cyanocobalamin (B-12 PO) Take 1 tablet by mouth daily.   diazepam (VALIUM) 2 MG tablet Take 2 mg by mouth every 12 (twelve) hours as needed for anxiety.    hydrochlorothiazide (HYDRODIURIL) 12.5 MG tablet Take 12.5 mg by mouth every morning.   ketoconazole (NIZORAL) 2 % shampoo Apply 1 application topically daily.   Multiple Vitamin (MULTIVITAMIN PO) Take 1 tablet by mouth daily.   nitroGLYCERIN (NITROSTAT) 0.4 MG SL tablet Place 0.4 mg under the tongue every 5 (five) minutes as needed for chest pain.    oxyCODONE (ROXICODONE) 5 MG immediate release tablet Take 1 tablet (5 mg total) by mouth every 4 (four) hours as needed for severe pain.   PRED FORTE 1 % ophthalmic suspension Place 1 drop into the left eye in the morning, at noon, in the evening, and at bedtime.  1 drop in the right eye 3 times daily   rosuvastatin (CRESTOR) 10 MG tablet Take 10 mg by mouth at bedtime.   scopolamine (TRANSDERM-SCOP) 1 MG/3DAYS Place 1 patch onto the skin 3 (three) times daily as needed for dizziness.     Allergies:   Methotrexate, Methotrexate derivatives, Penicillins, and Ciprofloxacin   Social History   Socioeconomic History   Marital status: Single    Spouse name: Not on file   Number of children: Not on file   Years of education: Not on file   Highest education level: Not on file  Occupational History   Not on file  Tobacco Use   Smoking status: Former   Smokeless tobacco: Never  Substance and Sexual Activity   Alcohol use: Yes    Alcohol/week: 2.0 standard drinks    Types: 1 Glasses of wine, 1 Cans of beer per week   Drug use: No   Sexual activity: Not on file  Other Topics Concern   Not on file  Social History Narrative   Not on file   Social Determinants of Health   Financial Resource Strain: Not on file  Food Insecurity: Not on file  Transportation Needs: Not on  file  Physical Activity: Not on file  Stress: Not on file  Social Connections: Not on file     Family History: The patient's family history includes Allergic Disorder in her brother; Allergic rhinitis in her sister; Asthma in her brother, sister, and sister.  ROS:   Please see the history of present illness.    All other systems reviewed and are negative.  EKGs/Labs/Other Studies Reviewed:    The following studies were reviewed today: I discussed my findings with the patient at length.  EKG reveals sinus rhythm and nonspecific ST-T changes.   Recent Labs: 09/15/2021: ALT 22; BUN 14; Creatinine, Ser 0.65; Hemoglobin 12.3; Platelets 200; Potassium  3.4; Sodium 138  Recent Lipid Panel    Component Value Date/Time   CHOL 197 06/09/2008 2014   TRIG 101 06/09/2008 2014   HDL 73 06/09/2008 2014   CHOLHDL 2.7 Ratio 06/09/2008 2014   VLDL 20 06/09/2008 2014   LDLCALC 104 (H) 06/09/2008 2014    Physical Exam:    VS:  BP (!) 154/78    Pulse 77    Ht 5\' 6"  (1.676 m)    Wt 257 lb (116.6 kg)    SpO2 99%    BMI 41.48 kg/m     Wt Readings from Last 3 Encounters:  01/20/22 257 lb (116.6 kg)  09/15/21 260 lb 2.3 oz (118 kg)  05/05/21 259 lb 3.2 oz (117.6 kg)     GEN: Patient is in no acute distress HEENT: Normal NECK: No JVD; No carotid bruits LYMPHATICS: No lymphadenopathy CARDIAC: Hear sounds regular, 2/6 systolic murmur at the apex. RESPIRATORY:  Clear to auscultation without rales, wheezing or rhonchi  ABDOMEN: Soft, non-tender, non-distended MUSCULOSKELETAL:  No edema; No deformity  SKIN: Warm and dry NEUROLOGIC:  Alert and oriented x 3 PSYCHIATRIC:  Normal affect   Signed, Jenean Lindau, MD  01/20/2022 1:39 PM    [facility]

## 2022-01-20 NOTE — Patient Instructions (Signed)

## 2022-02-07 DIAGNOSIS — R633 Feeding difficulties, unspecified: Secondary | ICD-10-CM | POA: Diagnosis not present

## 2022-02-07 DIAGNOSIS — E538 Deficiency of other specified B group vitamins: Secondary | ICD-10-CM | POA: Diagnosis not present

## 2022-02-07 DIAGNOSIS — H547 Unspecified visual loss: Secondary | ICD-10-CM | POA: Diagnosis not present

## 2022-02-07 DIAGNOSIS — E559 Vitamin D deficiency, unspecified: Secondary | ICD-10-CM | POA: Diagnosis not present

## 2022-03-07 DIAGNOSIS — H547 Unspecified visual loss: Secondary | ICD-10-CM | POA: Diagnosis not present

## 2022-03-07 DIAGNOSIS — E559 Vitamin D deficiency, unspecified: Secondary | ICD-10-CM | POA: Diagnosis not present

## 2022-03-07 DIAGNOSIS — E538 Deficiency of other specified B group vitamins: Secondary | ICD-10-CM | POA: Diagnosis not present

## 2022-03-07 DIAGNOSIS — R633 Feeding difficulties, unspecified: Secondary | ICD-10-CM | POA: Diagnosis not present

## 2022-04-06 DIAGNOSIS — E538 Deficiency of other specified B group vitamins: Secondary | ICD-10-CM | POA: Diagnosis not present

## 2022-04-06 DIAGNOSIS — H547 Unspecified visual loss: Secondary | ICD-10-CM | POA: Diagnosis not present

## 2022-04-06 DIAGNOSIS — E559 Vitamin D deficiency, unspecified: Secondary | ICD-10-CM | POA: Diagnosis not present

## 2022-04-06 DIAGNOSIS — R633 Feeding difficulties, unspecified: Secondary | ICD-10-CM | POA: Diagnosis not present

## 2022-04-11 ENCOUNTER — Ambulatory Visit: Payer: Medicare Other | Admitting: Sports Medicine

## 2022-04-14 ENCOUNTER — Ambulatory Visit: Payer: Medicare Other | Admitting: Sports Medicine

## 2022-04-18 DIAGNOSIS — J45909 Unspecified asthma, uncomplicated: Secondary | ICD-10-CM | POA: Diagnosis not present

## 2022-05-19 DIAGNOSIS — H20823 Vogt-Koyanagi syndrome, bilateral: Secondary | ICD-10-CM | POA: Diagnosis not present

## 2022-05-31 DIAGNOSIS — M7661 Achilles tendinitis, right leg: Secondary | ICD-10-CM | POA: Diagnosis not present

## 2022-06-05 DIAGNOSIS — Z0000 Encounter for general adult medical examination without abnormal findings: Secondary | ICD-10-CM | POA: Diagnosis not present

## 2022-06-05 DIAGNOSIS — E785 Hyperlipidemia, unspecified: Secondary | ICD-10-CM | POA: Diagnosis not present

## 2022-06-05 DIAGNOSIS — Z9181 History of falling: Secondary | ICD-10-CM | POA: Diagnosis not present

## 2022-06-07 DIAGNOSIS — H547 Unspecified visual loss: Secondary | ICD-10-CM | POA: Diagnosis not present

## 2022-06-07 DIAGNOSIS — E538 Deficiency of other specified B group vitamins: Secondary | ICD-10-CM | POA: Diagnosis not present

## 2022-06-07 DIAGNOSIS — R633 Feeding difficulties, unspecified: Secondary | ICD-10-CM | POA: Diagnosis not present

## 2022-06-07 DIAGNOSIS — E559 Vitamin D deficiency, unspecified: Secondary | ICD-10-CM | POA: Diagnosis not present

## 2022-06-19 DIAGNOSIS — Z79899 Other long term (current) drug therapy: Secondary | ICD-10-CM | POA: Diagnosis not present

## 2022-06-19 DIAGNOSIS — E559 Vitamin D deficiency, unspecified: Secondary | ICD-10-CM | POA: Diagnosis not present

## 2022-06-19 DIAGNOSIS — E785 Hyperlipidemia, unspecified: Secondary | ICD-10-CM | POA: Diagnosis not present

## 2022-06-19 DIAGNOSIS — M87052 Idiopathic aseptic necrosis of left femur: Secondary | ICD-10-CM | POA: Diagnosis not present

## 2022-06-19 DIAGNOSIS — I10 Essential (primary) hypertension: Secondary | ICD-10-CM | POA: Diagnosis not present

## 2022-06-19 DIAGNOSIS — J4520 Mild intermittent asthma, uncomplicated: Secondary | ICD-10-CM | POA: Diagnosis not present

## 2022-06-19 DIAGNOSIS — E538 Deficiency of other specified B group vitamins: Secondary | ICD-10-CM | POA: Diagnosis not present

## 2022-06-19 DIAGNOSIS — I700 Atherosclerosis of aorta: Secondary | ICD-10-CM | POA: Diagnosis not present

## 2022-06-29 DIAGNOSIS — Z1231 Encounter for screening mammogram for malignant neoplasm of breast: Secondary | ICD-10-CM | POA: Diagnosis not present

## 2022-07-07 DIAGNOSIS — R633 Feeding difficulties, unspecified: Secondary | ICD-10-CM | POA: Diagnosis not present

## 2022-07-07 DIAGNOSIS — H547 Unspecified visual loss: Secondary | ICD-10-CM | POA: Diagnosis not present

## 2022-07-07 DIAGNOSIS — E559 Vitamin D deficiency, unspecified: Secondary | ICD-10-CM | POA: Diagnosis not present

## 2022-07-07 DIAGNOSIS — E538 Deficiency of other specified B group vitamins: Secondary | ICD-10-CM | POA: Diagnosis not present

## 2022-07-12 DIAGNOSIS — M87852 Other osteonecrosis, left femur: Secondary | ICD-10-CM | POA: Diagnosis not present

## 2022-07-12 DIAGNOSIS — M25552 Pain in left hip: Secondary | ICD-10-CM | POA: Diagnosis not present

## 2022-07-13 DIAGNOSIS — M7661 Achilles tendinitis, right leg: Secondary | ICD-10-CM | POA: Diagnosis not present

## 2022-07-13 DIAGNOSIS — M25571 Pain in right ankle and joints of right foot: Secondary | ICD-10-CM | POA: Diagnosis not present

## 2022-07-19 IMAGING — CT CT RENAL STONE PROTOCOL
2 of 4 series · 17 of 46 positions shown, 19 images · non-contrast
Comparison: February 26, 2021.

CLINICAL DATA: Left flank pain.

EXAM:
CT ABDOMEN AND PELVIS WITHOUT CONTRAST
TECHNIQUE: Multidetector CT imaging of the abdomen and pelvis was performed
following the standard protocol without IV contrast.

[Series 2: axial st · axial · 0.98mm/px · z∈[+978,+1368]mm · 14 of 88 slices shown, 16 images]
[im 5/88  soft-tissue]
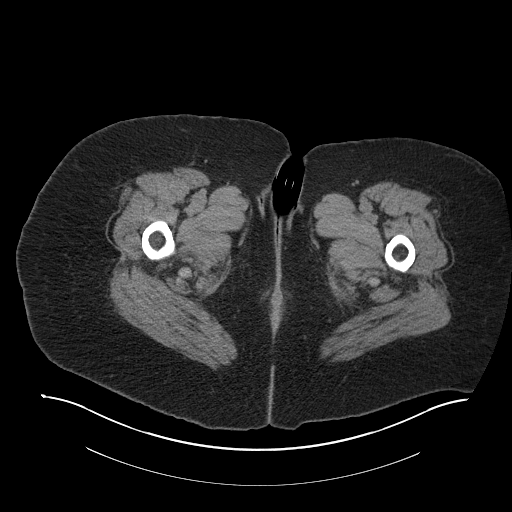
[im 5/88  bone]
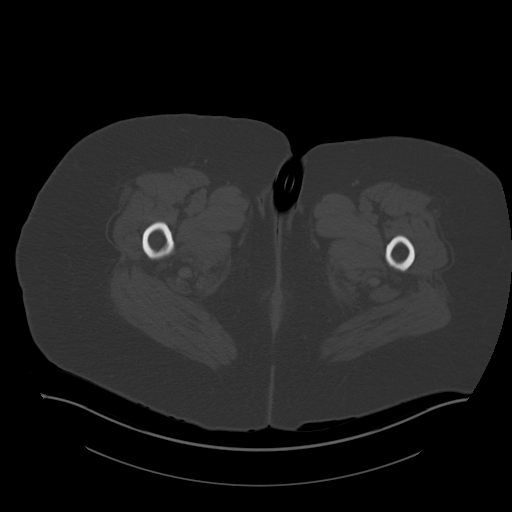
[im 10/88  soft-tissue]
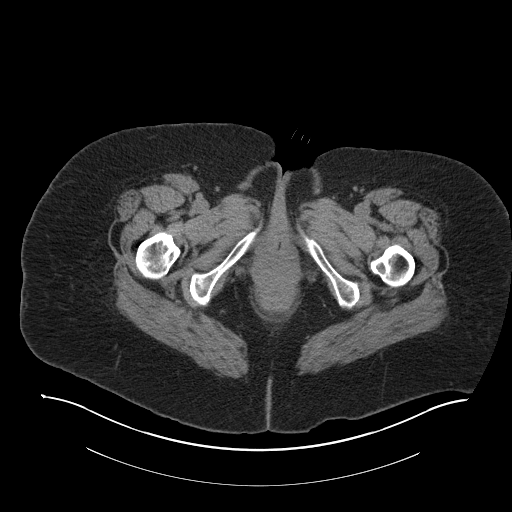
[im 19/88  soft-tissue]
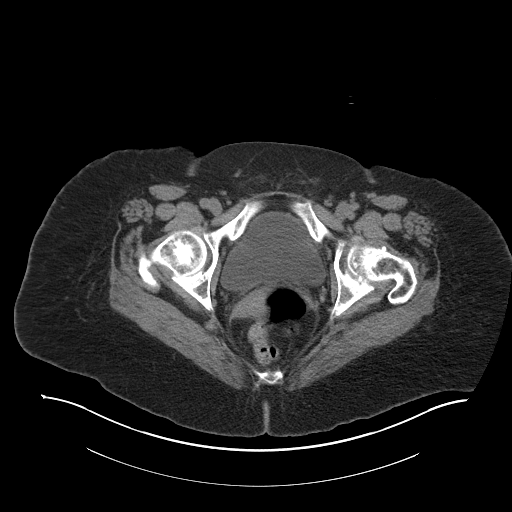
[im 23/88  soft-tissue]
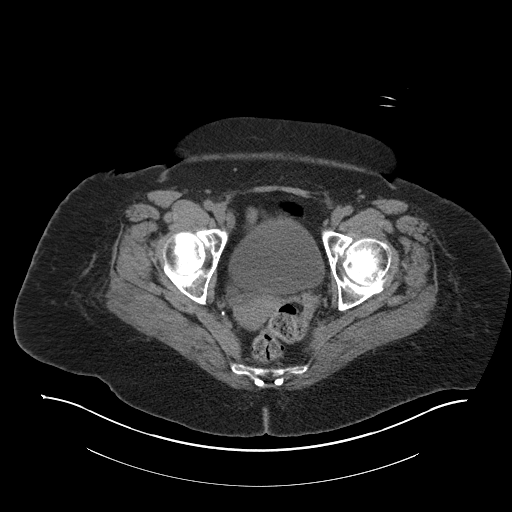
[im 28/88  soft-tissue]
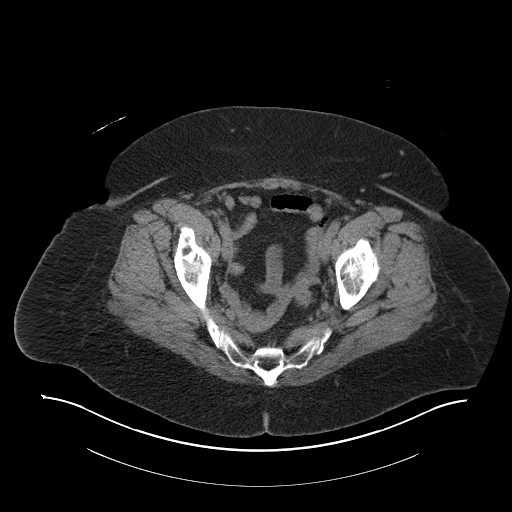
[im 37/88  soft-tissue]
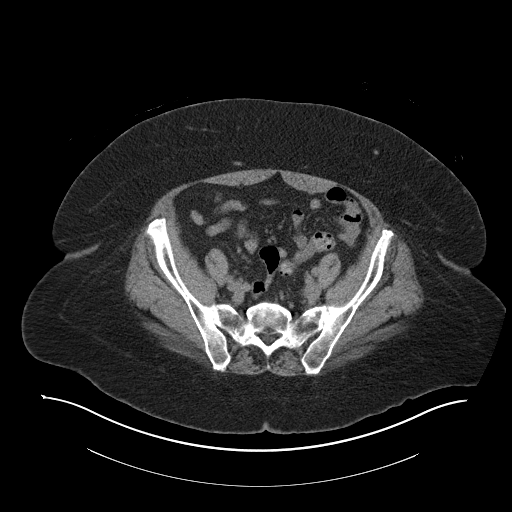
[im 42/88  soft-tissue]
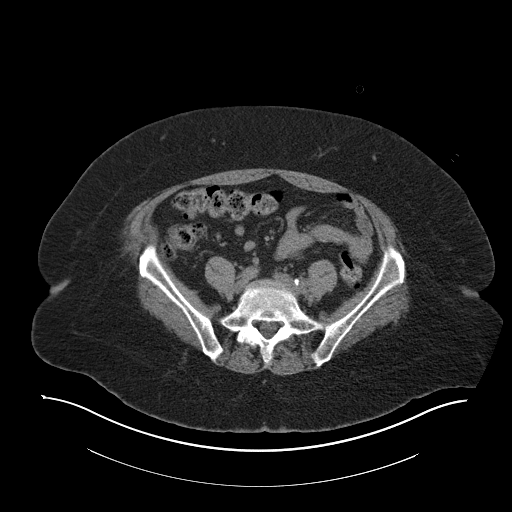
[im 46/88  soft-tissue]
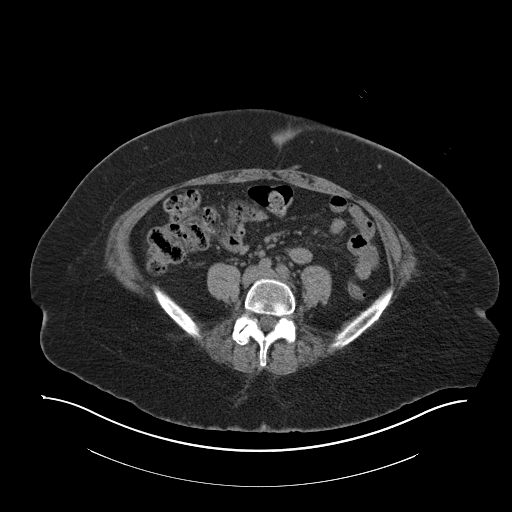
[im 51/88  soft-tissue]
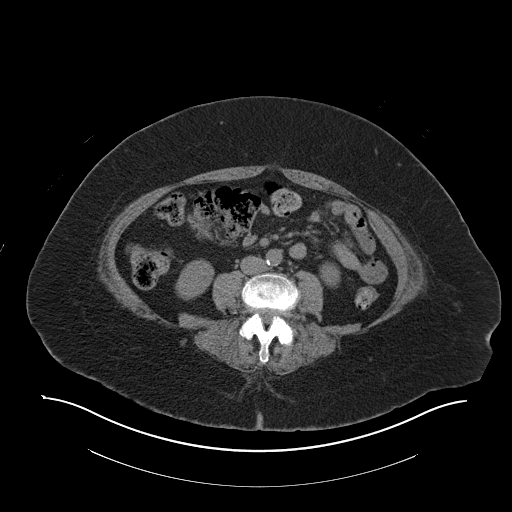
[im 51/88  bone]
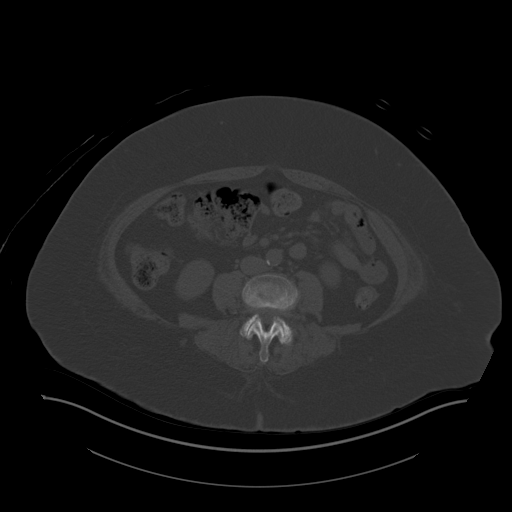
[im 60/88  soft-tissue]
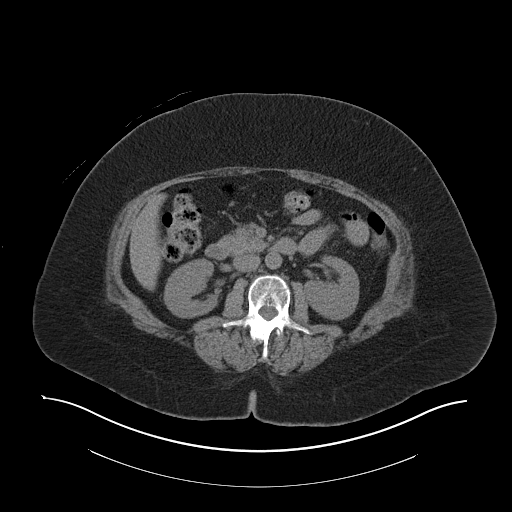
[im 65/88  soft-tissue]
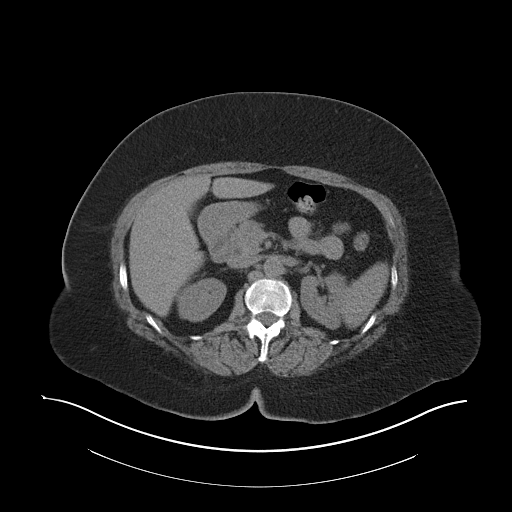
[im 69/88  soft-tissue]
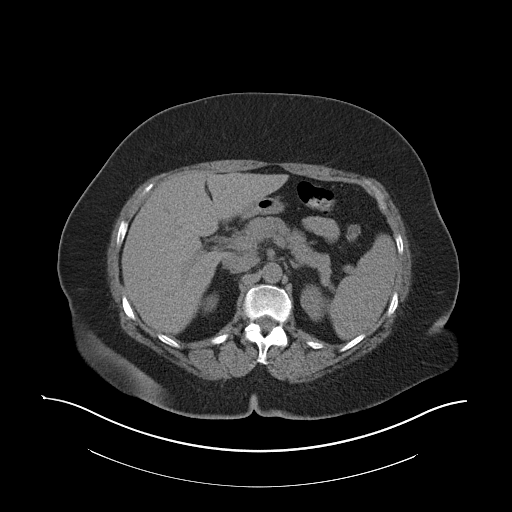
[im 78/88  soft-tissue]
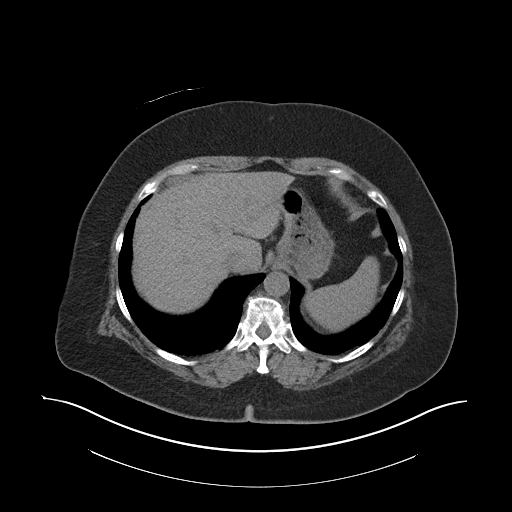
[im 83/88  soft-tissue]
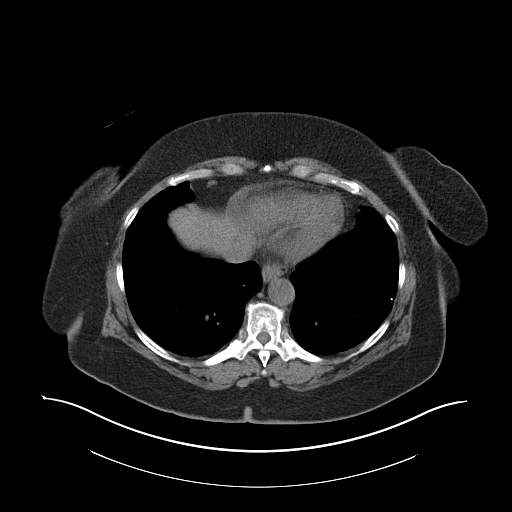

[Series 5: coronal · coronal · 0.90mm/px · 3 of 179 slices shown]
[im 60/179  soft-tissue]
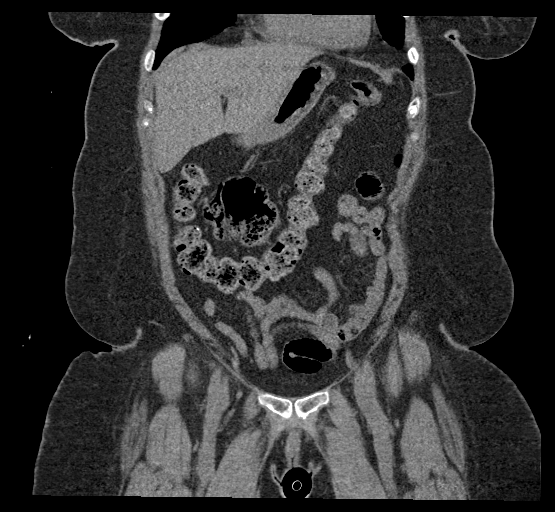
[im 80/179  soft-tissue]
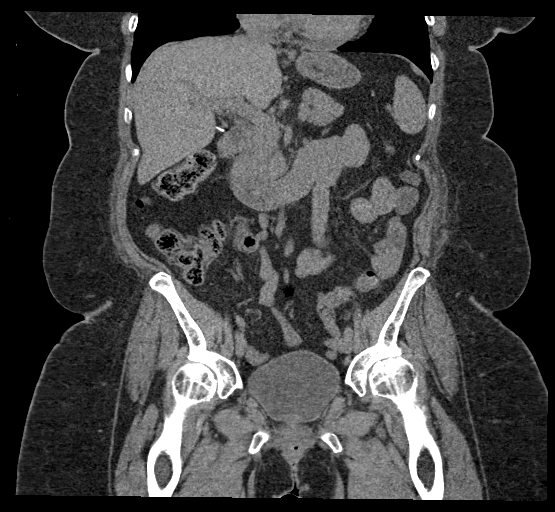
[im 99/179  soft-tissue]
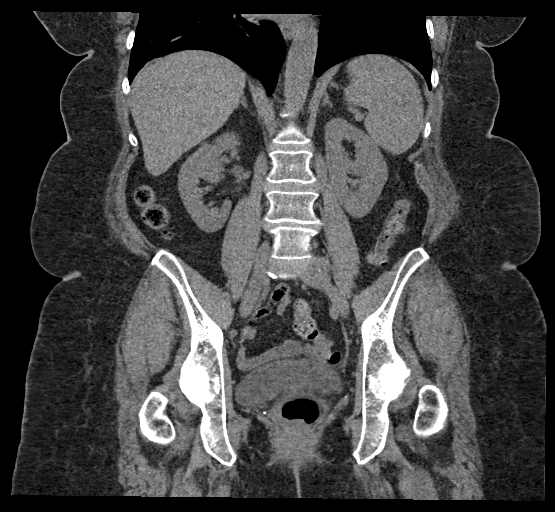

[17 of 46 positions shown; findings below may reference images not displayed]

FINDINGS: Lower chest: No acute abnormality.

Hepatobiliary: No focal liver abnormality is seen. Status post
cholecystectomy. No biliary dilatation.

Pancreas: Unremarkable. No pancreatic ductal dilatation or
surrounding inflammatory changes.

Spleen: Normal in size without focal abnormality.

Adrenals/Urinary Tract: Adrenal glands are unremarkable. Kidneys are
normal, without renal calculi, focal lesion, or hydronephrosis.
Bladder is unremarkable.

Stomach/Bowel: Stomach is within normal limits. Appendix appears
normal. No evidence of bowel wall thickening, distention, or
inflammatory changes.

Vascular/Lymphatic: Aortic atherosclerosis. No enlarged abdominal or
pelvic lymph nodes.

Reproductive: Uterus and bilateral adnexa are unremarkable.

Other: No abdominal wall hernia or abnormality. No abdominopelvic
ascites.

Musculoskeletal: Avascular necrosis is seen involving both femoral
heads. No acute osseous abnormality is noted.
IMPRESSION: Avascular necrosis is seen involving both femoral heads.

No acute abnormality seen in the abdomen or pelvis.

Aortic Atherosclerosis (ZQMDS-FR8.8).

## 2022-07-20 DIAGNOSIS — M25571 Pain in right ankle and joints of right foot: Secondary | ICD-10-CM | POA: Diagnosis not present

## 2022-07-20 DIAGNOSIS — M7661 Achilles tendinitis, right leg: Secondary | ICD-10-CM | POA: Diagnosis not present

## 2022-07-25 DIAGNOSIS — M7661 Achilles tendinitis, right leg: Secondary | ICD-10-CM | POA: Diagnosis not present

## 2022-07-25 DIAGNOSIS — M25571 Pain in right ankle and joints of right foot: Secondary | ICD-10-CM | POA: Diagnosis not present

## 2022-07-26 DIAGNOSIS — R6889 Other general symptoms and signs: Secondary | ICD-10-CM | POA: Diagnosis not present

## 2022-07-26 DIAGNOSIS — U071 COVID-19: Secondary | ICD-10-CM | POA: Diagnosis not present

## 2022-07-26 DIAGNOSIS — M87852 Other osteonecrosis, left femur: Secondary | ICD-10-CM | POA: Diagnosis not present

## 2022-07-26 DIAGNOSIS — M1612 Unilateral primary osteoarthritis, left hip: Secondary | ICD-10-CM | POA: Diagnosis not present

## 2022-08-07 DIAGNOSIS — E538 Deficiency of other specified B group vitamins: Secondary | ICD-10-CM | POA: Diagnosis not present

## 2022-08-07 DIAGNOSIS — R633 Feeding difficulties, unspecified: Secondary | ICD-10-CM | POA: Diagnosis not present

## 2022-08-07 DIAGNOSIS — H547 Unspecified visual loss: Secondary | ICD-10-CM | POA: Diagnosis not present

## 2022-08-07 DIAGNOSIS — E559 Vitamin D deficiency, unspecified: Secondary | ICD-10-CM | POA: Diagnosis not present

## 2022-08-15 DIAGNOSIS — R059 Cough, unspecified: Secondary | ICD-10-CM | POA: Diagnosis not present

## 2022-08-16 DIAGNOSIS — L299 Pruritus, unspecified: Secondary | ICD-10-CM | POA: Diagnosis not present

## 2022-08-22 ENCOUNTER — Other Ambulatory Visit: Payer: Self-pay | Admitting: Orthopedic Surgery

## 2022-08-22 ENCOUNTER — Encounter: Payer: Self-pay | Admitting: Orthopedic Surgery

## 2022-08-22 DIAGNOSIS — Z01818 Encounter for other preprocedural examination: Secondary | ICD-10-CM

## 2022-08-22 NOTE — H&P (Deleted)
  The note originally documented on this encounter has been moved the the encounter in which it belongs.  

## 2022-08-22 NOTE — H&P (Signed)
NAME: Jillian Taylor MRN:   [id number] DOB:   [birth year]     HISTORY AND PHYSICAL  CHIEF COMPLAINT:  left hip pain  HISTORY:   Jillian Taylor a 71 y.o. female  with left  Hip Pain Patient complains of left hip pain. Onset of the symptoms was several years ago. Inciting event: none. The patient reports the hip pain is worse with weight bearing. Associated symptoms: none. Aggravating symptoms include: any weight bearing. Patient has had prior hip problems. Previous visits for this problem: yes, last seen several weeks ago by Dr. Odis Luster . Evaluation to date: plain films, which were abnormal  osteoarthritis . Treatment to date: OTC analgesics, which have been somewhat effective, prescription analgesics, which have been somewhat effective, and home exercise program, which has been somewhat effective.    Plan for left total hip replacement  PAST MEDICAL HISTORY:   Past Medical History:  Diagnosis Date   Allergic rhinoconjunctivitis 11/08/2015   Alopecia areata 07/02/2007   Qualifier: Diagnosis of  By: Audria Nine MD, Britta Mccreedy     ANXIETY DEPRESSION 07/02/2007   Qualifier: Diagnosis of  By: Audria Nine MD, Britta Mccreedy     Arthritis    Aseptic necrosis of head or neck of femur 12/08/2011   Asthma    BACK PAIN, LUMBAR, CHRONIC 07/02/2007   Annotation: 5 mm retrolisthesis L5 on L4 Qualifier: Diagnosis of  By: Audria Nine MD, Barbara     Blind    BPPV (benign paroxysmal positional vertigo), right 04/15/2020   Chest discomfort 03/24/2019   CONSTIPATION, RECURRENT 07/02/2007   Qualifier: Diagnosis of  By: Audria Nine MD, Britta Mccreedy     Eczema    Essential hypertension 03/24/2019   Hearing loss 04/15/2020   Hypertension    LPRD (laryngopharyngeal reflux disease) 07/02/2007   Qualifier: Diagnosis of  By: Audria Nine MD, Geri [place] SYNDROME 07/02/2007   Qualifier: Diagnosis of  By: Audria Nine MD, Barbara     Mild intermittent asthma 07/02/2007   Annotation: Possible Reactive airway disease Qualifier: Diagnosis of   By: Audria Nine MD, Barbara     Mixed dyslipidemia 03/24/2019   Morbid obesity (HCC) 12/08/2011   Obesity (BMI 35.0-39.9 without comorbidity) 10/28/2020   Overweight 03/24/2019   PULMONARY NODULE 07/02/2007   Qualifier: History of  By: Audria Nine MD, [place] USE, QUIT 07/02/2007   Qualifier: Diagnosis of  By: Audria Nine MD, Britta Mccreedy     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 Qualifier: Diagnosis of  By: Audria Nine MD, Britta Mccreedy      PAST SURGICAL HISTORY:   Past Surgical History:  Procedure Laterality Date   CHOLECYSTECTOMY     EYE SURGERY     LUNG BIOPSY     TONSILLECTOMY      MEDICATIONS:  (Not in a hospital admission)   ALLERGIES:   Allergies  Allergen Reactions   Methotrexate Hives   Methotrexate Derivatives Hives   Penicillins Hives    Has patient had a PCN reaction causing immediate rash, facial/tongue/throat swelling, SOB or lightheadedness with hypotension: Yes Has patient had a PCN reaction causing severe rash involving mucus membranes or skin necrosis: Yes Has patient had a PCN reaction that required hospitalization No Has patient had a PCN reaction occurring within the last 10 years: No If all of the above answers are "NO", then may proceed with Cephalosporin use.    Ciprofloxacin Rash    Reported by patient     REVIEW OF SYSTEMS:   Negative except HPI  FAMILY  HISTORY:   Family History  Problem Relation Age of Onset   Asthma Sister    Allergic rhinitis Sister    Asthma Brother    Allergic Disorder Brother    Asthma Sister     SOCIAL HISTORY:   reports that she has quit smoking. She has never used smokeless tobacco. She reports current alcohol use of about 2.0 standard drinks of alcohol per week. She reports that she does not use drugs.  PHYSICAL EXAM:  General appearance: alert, cooperative, and no distress Neck: no JVD and supple, symmetrical, trachea midline Resp: clear to auscultation bilaterally Cardio: regular rate and  rhythm, S1, S2 normal, no murmur, click, rub or gallop GI: soft, non-tender; bowel sounds normal; no masses,  no organomegaly Extremities: extremities normal, atraumatic, no cyanosis or edema and Homans sign is negative, no sign of DVT Pulses: 2+ and symmetric Skin: Skin color, texture, turgor normal. No rashes or lesions    LABORATORY STUDIES: No results for input(s): "WBC", "HGB", "HCT", "PLT" in the last 72 hours.  No results for input(s): "NA", "K", "CL", "CO2", "GLUCOSE", "BUN", "CREATININE", "CALCIUM" in the last 72 hours.  STUDIES/RESULTS:  No results found.  ASSESSMENT:  End stage osteoarthritis left hip        Active Problems:   * No active hospital problems. *    PLAN:  Left Primary Total Hip   Altamese Cabal 08/22/2022. 2:48 PM

## 2022-09-01 DIAGNOSIS — M26623 Arthralgia of bilateral temporomandibular joint: Secondary | ICD-10-CM | POA: Diagnosis not present

## 2022-09-01 DIAGNOSIS — M542 Cervicalgia: Secondary | ICD-10-CM | POA: Diagnosis not present

## 2022-09-01 DIAGNOSIS — Z880 Allergy status to penicillin: Secondary | ICD-10-CM | POA: Diagnosis not present

## 2022-09-01 DIAGNOSIS — H6502 Acute serous otitis media, left ear: Secondary | ICD-10-CM | POA: Diagnosis not present

## 2022-09-01 DIAGNOSIS — M26603 Bilateral temporomandibular joint disorder, unspecified: Secondary | ICD-10-CM | POA: Diagnosis not present

## 2022-09-01 DIAGNOSIS — R6889 Other general symptoms and signs: Secondary | ICD-10-CM | POA: Diagnosis not present

## 2022-09-01 DIAGNOSIS — Z79899 Other long term (current) drug therapy: Secondary | ICD-10-CM | POA: Diagnosis not present

## 2022-09-01 DIAGNOSIS — I10 Essential (primary) hypertension: Secondary | ICD-10-CM | POA: Diagnosis not present

## 2022-09-01 DIAGNOSIS — Z743 Need for continuous supervision: Secondary | ICD-10-CM | POA: Diagnosis not present

## 2022-09-04 ENCOUNTER — Encounter: Payer: Self-pay | Admitting: Urgent Care

## 2022-09-04 ENCOUNTER — Encounter
Admission: RE | Admit: 2022-09-04 | Discharge: 2022-09-04 | Disposition: A | Discharge status: Home / Self Care | Payer: Medicare Other | Source: Ambulatory Visit | Attending: Orthopedic Surgery | Admitting: Orthopedic Surgery

## 2022-09-04 DIAGNOSIS — I10 Essential (primary) hypertension: Secondary | ICD-10-CM

## 2022-09-04 DIAGNOSIS — Z01810 Encounter for preprocedural cardiovascular examination: Secondary | ICD-10-CM | POA: Diagnosis not present

## 2022-09-04 DIAGNOSIS — Z01812 Encounter for preprocedural laboratory examination: Secondary | ICD-10-CM

## 2022-09-04 DIAGNOSIS — Z01818 Encounter for other preprocedural examination: Secondary | ICD-10-CM

## 2022-09-04 LAB — SURGICAL PCR SCREEN
MRSA, PCR: NEGATIVE
Staphylococcus aureus: NEGATIVE

## 2022-09-04 LAB — URINALYSIS, ROUTINE W REFLEX MICROSCOPIC
Bacteria, UA: NONE SEEN
Bilirubin Urine: NEGATIVE
Glucose, UA: NEGATIVE mg/dL
Hgb urine dipstick: NEGATIVE
Ketones, ur: NEGATIVE mg/dL
Nitrite: NEGATIVE
Protein, ur: NEGATIVE mg/dL
Specific Gravity, Urine: 1.018 (ref 1.005–1.030)
pH: 5 (ref 5.0–8.0)

## 2022-09-04 LAB — BASIC METABOLIC PANEL
Anion gap: 8 (ref 5–15)
BUN: 24 mg/dL — ABNORMAL HIGH (ref 8–23)
CO2: 26 mmol/L (ref 22–32)
Calcium: 9.4 mg/dL (ref 8.9–10.3)
Chloride: 105 mmol/L (ref 98–111)
Creatinine, Ser: 0.8 mg/dL (ref 0.44–1.00)
GFR, Estimated: >60 mL/min (ref >60)
Glucose, Bld: 93 mg/dL (ref 70–99)
Potassium: 3.8 mmol/L (ref 3.5–5.1)
Sodium: 139 mmol/L (ref 135–145)

## 2022-09-04 LAB — CBC
HCT: 38.4 % (ref 36.0–46.0)
Hemoglobin: 11.5 g/dL — ABNORMAL LOW (ref 12.0–15.0)
MCH: 21.7 pg — ABNORMAL LOW (ref 26.0–34.0)
MCHC: 29.9 g/dL — ABNORMAL LOW (ref 30.0–36.0)
MCV: 72.5 fL — ABNORMAL LOW (ref 80.0–100.0)
Platelets: 181 10*3/uL (ref 150–400)
RBC: 5.3 MIL/uL — ABNORMAL HIGH (ref 3.87–5.11)
RDW: 15.4 % (ref 11.5–15.5)
WBC: 10.3 10*3/uL (ref 4.0–10.5)
nRBC: 0 % (ref 0.0–0.2)

## 2022-09-04 NOTE — Patient Instructions (Addendum)
Your procedure is scheduled on: Tuesday, September 19 Report to the Registration Desk on the 1st floor of the [facility] To find out your arrival time, please call [phone] between 1PM - 3PM on: Monday, September 18 If your arrival time is 6:00 am, do not arrive prior to that time as the [facility] entrance doors do not open until 6:00 am.  REMEMBER: Instructions that are not followed completely may result in serious medical risk, up to and including death; or upon the discretion of your surgeon and anesthesiologist your surgery may need to be rescheduled.  Do not eat or drink after midnight the night before surgery.  No gum chewing, lozengers or hard candies.  TAKE THESE MEDICATIONS THE MORNING OF SURGERY WITH A SIP OF WATER:  Amlodipine Rosuvastatin Albuterol nebulizer  Use inhalers on the day of surgery and bring to the hospital.  One week prior to surgery: starting September 12 Stop Anti-inflammatories (NSAIDS) such as Advil, Aleve, Ibuprofen, Motrin, Naproxen, Naprosyn and Aspirin based products such as Excedrin, Goodys Powder, BC Powder. Stop ANY OVER THE COUNTER supplements until after surgery. Stop multiple vitamins, vitamin D You may however, continue to take Tylenol if needed for pain up until the day of surgery.  No Alcohol for 24 hours before or after surgery.  No Smoking including e-cigarettes for 24 hours prior to surgery.  No chewable tobacco products for at least 6 hours prior to surgery.  No nicotine patches on the day of surgery.  Do not use any "recreational" drugs for at least a week prior to your surgery.  Please be advised that the combination of cocaine and anesthesia may have negative outcomes, up to and including death. If you test positive for cocaine, your surgery will be cancelled.  On the morning of surgery brush your teeth with toothpaste and water, you may rinse your mouth with mouthwash if you wish. Do not swallow any toothpaste or  mouthwash.  Use CHG Soap as directed on instruction sheet.  Do not wear jewelry, make-up, hairpins, clips or nail polish.  Do not wear lotions, powders, or perfumes.   Do not shave body from the neck down 48 hours prior to surgery just in case you cut yourself which could leave a site for infection.  Also, freshly shaved skin may become irritated if using the CHG soap.  Contact lenses, hearing aids and dentures may not be worn into surgery.  Do not bring valuables to the hospital. [facility] is not responsible for any missing/lost belongings or valuables.   Notify your doctor if there is any change in your medical condition (cold, fever, infection).  Wear comfortable clothing (specific to your surgery type) to the hospital.  After surgery, you can help prevent lung complications by doing breathing exercises.  Take deep breaths and cough every 1-2 hours. Your doctor may order a device called an Incentive Spirometer to help you take deep breaths.  If you are being admitted to the hospital overnight, leave your suitcase in the car. After surgery it may be brought to your room.  If you are being discharged the day of surgery, you will not be allowed to drive home. You will need a responsible adult (18 years or older) to drive you home and stay with you that night.   If you are taking public transportation, you will need to have a responsible adult (18 years or older) with you. Please confirm with your physician that it is acceptable to use public transportation.  Please call the Pre-admissions Testing Dept. at [phone] if you have any questions about these instructions.  Surgery Visitation Policy:  Patients undergoing a surgery or procedure may have two family members or support persons with them as long as the person is not COVID-19 positive or experiencing its symptoms.   Inpatient Visitation:    Visiting hours are 7 a.m. to 8 p.m. Up to four visitors are allowed at one  time in a patient room, including children. The visitors may rotate out with other people during the day. One designated support person (adult) may remain overnight.      Preparing for Surgery with CHLORHEXIDINE GLUCONATE (CHG) Soap  Before surgery, you can play an important role by reducing the number of germs on your skin.  CHG (Chlorhexidine gluconate) soap is an antiseptic cleanser which kills germs and bonds with the skin to continue killing germs even after washing.  Please do not use if you have an allergy to CHG or antibacterial soaps. If your skin becomes reddened/irritated stop using the CHG.  1. Shower the NIGHT BEFORE SURGERY and the MORNING OF SURGERY with CHG soap.  2. If you choose to wash your hair, wash your hair first as usual with your normal shampoo.  3. After shampooing, rinse your hair and body thoroughly to remove the shampoo.  4. Use CHG as you would any other liquid soap. You can apply CHG directly to the skin and wash gently with a scrungie or a clean washcloth.  5. Apply the CHG soap to your body only from the neck down. Do not use on open wounds or open sores. Avoid contact with your eyes, ears, mouth, and genitals (private parts). Wash face and genitals (private parts) with your normal soap.  6. Wash thoroughly, paying special attention to the area where your surgery will be performed.  7. Thoroughly rinse your body with warm water.  8. Do not shower/wash with your normal soap after using and rinsing off the CHG soap.  9. Pat yourself dry with a clean towel.  10. Wear clean pajamas to bed the night before surgery.  12. Place clean sheets on your bed the night of your first shower and do not sleep with pets.  13. Shower again with the CHG soap on the day of surgery prior to arriving at the hospital.  14. Do not apply any deodorants/lotions/powders.  15. Please wear clean clothes to the hospital.

## 2022-09-06 DIAGNOSIS — H9312 Tinnitus, left ear: Secondary | ICD-10-CM | POA: Diagnosis not present

## 2022-09-06 DIAGNOSIS — H60542 Acute eczematoid otitis externa, left ear: Secondary | ICD-10-CM | POA: Diagnosis not present

## 2022-09-06 DIAGNOSIS — H6993 Unspecified Eustachian tube disorder, bilateral: Secondary | ICD-10-CM | POA: Diagnosis not present

## 2022-09-06 NOTE — TOC Progression Note (Signed)
Transition of Care [facility]) - Progression Note    Patient Details  Name: Jillian Taylor MRN: [id number] Date of Birth: 17-May-1951  Transition of Care [facility]) CM/SW Contact  Marlowe Sax, RN Phone Number: 09/06/2022, 4:05 PM  Clinical Narrative:     Patient completed the worksheet for the Joints in motion class They report that they will need a rolling walker and a BSC [employer] will deliver the DME to the home prior to surgery        Expected Discharge Plan and Services                                                 Social Determinants of Health (SDOH) Interventions    Readmission Risk Interventions     No data to display

## 2022-09-07 DIAGNOSIS — M160 Bilateral primary osteoarthritis of hip: Secondary | ICD-10-CM | POA: Diagnosis not present

## 2022-09-08 DIAGNOSIS — E538 Deficiency of other specified B group vitamins: Secondary | ICD-10-CM | POA: Diagnosis not present

## 2022-09-08 DIAGNOSIS — H547 Unspecified visual loss: Secondary | ICD-10-CM | POA: Diagnosis not present

## 2022-09-08 DIAGNOSIS — R633 Feeding difficulties, unspecified: Secondary | ICD-10-CM | POA: Diagnosis not present

## 2022-09-08 DIAGNOSIS — E559 Vitamin D deficiency, unspecified: Secondary | ICD-10-CM | POA: Diagnosis not present

## 2022-09-12 ENCOUNTER — Ambulatory Visit: Payer: Medicare Other

## 2022-09-12 ENCOUNTER — Encounter
Admission: AD | Disposition: A | Discharge status: Skilled Nursing Facility | Payer: Self-pay | Source: Home / Self Care | Attending: Orthopedic Surgery

## 2022-09-12 ENCOUNTER — Encounter: Payer: Self-pay | Admitting: Orthopedic Surgery

## 2022-09-12 ENCOUNTER — Other Ambulatory Visit: Payer: Self-pay

## 2022-09-12 ENCOUNTER — Observation Stay
Admission: AD | Admit: 2022-09-12 | Discharge: 2022-09-15 | Disposition: A | Discharge status: Skilled Nursing Facility | Payer: Medicare Other | Attending: Orthopedic Surgery | Admitting: Orthopedic Surgery

## 2022-09-12 DIAGNOSIS — Z471 Aftercare following joint replacement surgery: Secondary | ICD-10-CM | POA: Diagnosis not present

## 2022-09-12 DIAGNOSIS — J45909 Unspecified asthma, uncomplicated: Secondary | ICD-10-CM | POA: Insufficient documentation

## 2022-09-12 DIAGNOSIS — Z96649 Presence of unspecified artificial hip joint: Secondary | ICD-10-CM

## 2022-09-12 DIAGNOSIS — Z96642 Presence of left artificial hip joint: Secondary | ICD-10-CM

## 2022-09-12 DIAGNOSIS — I10 Essential (primary) hypertension: Secondary | ICD-10-CM | POA: Diagnosis not present

## 2022-09-12 DIAGNOSIS — M1612 Unilateral primary osteoarthritis, left hip: Principal | ICD-10-CM | POA: Insufficient documentation

## 2022-09-12 DIAGNOSIS — Z01812 Encounter for preprocedural laboratory examination: Principal | ICD-10-CM

## 2022-09-12 DIAGNOSIS — Z87891 Personal history of nicotine dependence: Secondary | ICD-10-CM | POA: Diagnosis not present

## 2022-09-12 DIAGNOSIS — M87852 Other osteonecrosis, left femur: Secondary | ICD-10-CM | POA: Diagnosis not present

## 2022-09-12 HISTORY — DX: Presence of unspecified artificial hip joint: Z96.649

## 2022-09-12 HISTORY — DX: Presence of left artificial hip joint: Z96.642

## 2022-09-12 HISTORY — PX: TOTAL HIP ARTHROPLASTY: SHX124

## 2022-09-12 SURGERY — ARTHROPLASTY, HIP, TOTAL, ANTERIOR APPROACH
Anesthesia: Spinal | Site: Hip | Laterality: Left

## 2022-09-12 MED ORDER — FENTANYL CITRATE (PF) 100 MCG/2ML IJ SOLN
INTRAMUSCULAR | Status: AC
  Administered 2022-09-12: 25 ug via INTRAVENOUS
  Filled 2022-09-12: qty 2

## 2022-09-12 MED ORDER — DEXMEDETOMIDINE HCL IN NACL 80 MCG/20ML IV SOLN
INTRAVENOUS | Status: DC | PRN
  Administered 2022-09-12 (×2): 4 ug via BUCCAL

## 2022-09-12 MED ORDER — KETOROLAC TROMETHAMINE 15 MG/ML IJ SOLN
7.5000 mg | Freq: Four times a day (QID) | INTRAMUSCULAR | Status: AC
  Administered 2022-09-12: 7.5 mg via INTRAVENOUS

## 2022-09-12 MED ORDER — TRANEXAMIC ACID-NACL 1000-0.7 MG/100ML-% IV SOLN
INTRAVENOUS | Status: AC
  Filled 2022-09-12: qty 100

## 2022-09-12 MED ORDER — BISACODYL 10 MG RE SUPP: 10.0000 mg | Freq: Every day | RECTAL | Status: DC | PRN

## 2022-09-12 MED ORDER — OXYCODONE HCL 5 MG/5ML PO SOLN: 5.0000 mg | Freq: Once | ORAL | Status: DC | PRN

## 2022-09-12 MED ORDER — OXYCODONE HCL 5 MG PO TABS: 5.0000 mg | ORAL_TABLET | Freq: Once | ORAL | Status: DC | PRN

## 2022-09-12 MED ORDER — SURGIPHOR WOUND IRRIGATION SYSTEM - OPTIME
TOPICAL | Status: DC | PRN
  Administered 2022-09-12: 450 mL via TOPICAL

## 2022-09-12 MED ORDER — ALBUTEROL SULFATE HFA 108 (90 BASE) MCG/ACT IN AERS: 2.0000 | INHALATION_SPRAY | RESPIRATORY_TRACT | Status: DC | PRN

## 2022-09-12 MED ORDER — AMLODIPINE BESYLATE 5 MG PO TABS
2.5000 mg | ORAL_TABLET | Freq: Every day | ORAL | Status: DC
  Administered 2022-09-13 – 2022-09-14 (×2): 2.5 mg via ORAL
  Filled 2022-09-12 (×2): qty 1

## 2022-09-12 MED ORDER — FLUTICASONE PROPIONATE 50 MCG/ACT NA SUSP
1.0000 | Freq: Every day | NASAL | Status: DC
  Administered 2022-09-13 – 2022-09-15 (×3): 1 via NASAL
  Filled 2022-09-12: qty 16

## 2022-09-12 MED ORDER — ONDANSETRON HCL 4 MG/2ML IJ SOLN: 4.0000 mg | Freq: Four times a day (QID) | INTRAMUSCULAR | Status: DC | PRN

## 2022-09-12 MED ORDER — SODIUM CHLORIDE 0.9 % IV SOLN
INTRAVENOUS | Status: AC | PRN
  Administered 2022-09-12: 250 mL

## 2022-09-12 MED ORDER — CHLORHEXIDINE GLUCONATE 0.12 % MT SOLN
15.0000 mL | Freq: Once | OROMUCOSAL | Status: AC
  Administered 2022-09-12: 15 mL via OROMUCOSAL

## 2022-09-12 MED ORDER — SODIUM CHLORIDE 0.9 % IR SOLN
Status: DC | PRN
  Administered 2022-09-12: 3000 mL

## 2022-09-12 MED ORDER — DOCUSATE SODIUM 100 MG PO CAPS
100.0000 mg | ORAL_CAPSULE | Freq: Two times a day (BID) | ORAL | Status: DC
  Administered 2022-09-12 – 2022-09-15 (×6): 100 mg via ORAL
  Filled 2022-09-12 (×6): qty 1

## 2022-09-12 MED ORDER — FENTANYL CITRATE (PF) 100 MCG/2ML IJ SOLN
INTRAMUSCULAR | Status: DC | PRN
  Administered 2022-09-12: 50 ug via INTRAVENOUS

## 2022-09-12 MED ORDER — CEFAZOLIN SODIUM-DEXTROSE 2-4 GM/100ML-% IV SOLN
2.0000 g | Freq: Four times a day (QID) | INTRAVENOUS | Status: AC
  Administered 2022-09-12 (×2): 2 g via INTRAVENOUS
  Filled 2022-09-12 (×2): qty 100

## 2022-09-12 MED ORDER — HYDROCODONE-ACETAMINOPHEN 5-325 MG PO TABS
ORAL_TABLET | ORAL | Status: AC
  Administered 2022-09-12: 1 via ORAL
  Filled 2022-09-12: qty 1

## 2022-09-12 MED ORDER — PHENYLEPHRINE HCL (PRESSORS) 10 MG/ML IV SOLN
INTRAVENOUS | Status: DC | PRN
  Administered 2022-09-12 (×3): 80 ug via INTRAVENOUS

## 2022-09-12 MED ORDER — LACTATED RINGERS IV SOLN: INTRAVENOUS | Status: DC

## 2022-09-12 MED ORDER — PHENOL 1.4 % MT LIQD: 1.0000 | OROMUCOSAL | Status: DC | PRN

## 2022-09-12 MED ORDER — ALBUTEROL SULFATE (2.5 MG/3ML) 0.083% IN NEBU: 2.5000 mg | INHALATION_SOLUTION | Freq: Four times a day (QID) | RESPIRATORY_TRACT | Status: DC | PRN

## 2022-09-12 MED ORDER — CEFAZOLIN SODIUM-DEXTROSE 2-4 GM/100ML-% IV SOLN
INTRAVENOUS | Status: AC
  Filled 2022-09-12: qty 100

## 2022-09-12 MED ORDER — ORAL CARE MOUTH RINSE
15.0000 mL | Freq: Once | OROMUCOSAL | Status: AC
Start: 2022-09-12 — End: 2022-09-12

## 2022-09-12 MED ORDER — MENTHOL 3 MG MT LOZG: 1.0000 | LOZENGE | OROMUCOSAL | Status: DC | PRN

## 2022-09-12 MED ORDER — 0.9 % SODIUM CHLORIDE (POUR BTL) OPTIME
TOPICAL | Status: DC | PRN
  Administered 2022-09-12: 1000 mL

## 2022-09-12 MED ORDER — BUPIVACAINE IN DEXTROSE 0.75-8.25 % IT SOLN: INTRATHECAL | Status: DC | PRN

## 2022-09-12 MED ORDER — FAMOTIDINE 20 MG PO TABS
20.0000 mg | ORAL_TABLET | Freq: Once | ORAL | Status: AC
  Administered 2022-09-12: 20 mg via ORAL

## 2022-09-12 MED ORDER — BUPIVACAINE-MELOXICAM ER 200-6 MG/7ML IJ SOLN
INTRAMUSCULAR | Status: AC
  Filled 2022-09-12: qty 1

## 2022-09-12 MED ORDER — PREDNISOLONE ACETATE 1 % OP SUSP
1.0000 [drp] | Freq: Two times a day (BID) | OPHTHALMIC | Status: DC
  Administered 2022-09-14 – 2022-09-15 (×2): 1 [drp] via OPHTHALMIC
  Filled 2022-09-12: qty 1

## 2022-09-12 MED ORDER — CHLORHEXIDINE GLUCONATE 0.12 % MT SOLN
OROMUCOSAL | Status: AC
  Filled 2022-09-12: qty 15

## 2022-09-12 MED ORDER — PHENYLEPHRINE HCL-NACL 20-0.9 MG/250ML-% IV SOLN
INTRAVENOUS | Status: DC | PRN
  Administered 2022-09-12: 30 ug/min via INTRAVENOUS

## 2022-09-12 MED ORDER — ONDANSETRON HCL 4 MG/2ML IJ SOLN: 4.0000 mg | Freq: Once | INTRAMUSCULAR | Status: DC | PRN

## 2022-09-12 MED ORDER — ONDANSETRON HCL 4 MG PO TABS: 4.0000 mg | ORAL_TABLET | Freq: Four times a day (QID) | ORAL | Status: DC | PRN

## 2022-09-12 MED ORDER — LORATADINE 10 MG PO TABS: 10.0000 mg | ORAL_TABLET | Freq: Every day | ORAL | Status: DC | PRN

## 2022-09-12 MED ORDER — BUPIVACAINE HCL (PF) 0.5 % IJ SOLN
INTRAMUSCULAR | Status: DC | PRN
  Administered 2022-09-12: 2.2 mL

## 2022-09-12 MED ORDER — FAMOTIDINE 20 MG PO TABS
ORAL_TABLET | ORAL | Status: AC
  Filled 2022-09-12: qty 1

## 2022-09-12 MED ORDER — ALUM & MAG HYDROXIDE-SIMETH 200-200-20 MG/5ML PO SUSP
30.0000 mL | ORAL | Status: DC | PRN
  Administered 2022-09-12: 30 mL via ORAL

## 2022-09-12 MED ORDER — CEFAZOLIN SODIUM-DEXTROSE 2-4 GM/100ML-% IV SOLN
2.0000 g | INTRAVENOUS | Status: AC
  Administered 2022-09-12: 2 g via INTRAVENOUS

## 2022-09-12 MED ORDER — BUPIVACAINE-EPINEPHRINE (PF) 0.25% -1:200000 IJ SOLN
INTRAMUSCULAR | Status: AC
  Filled 2022-09-12: qty 30

## 2022-09-12 MED ORDER — POVIDONE-IODINE 10 % EX SWAB: 2.0000 | Freq: Once | CUTANEOUS | Status: DC

## 2022-09-12 MED ORDER — HYDROCODONE-ACETAMINOPHEN 5-325 MG PO TABS
ORAL_TABLET | ORAL | Status: AC
  Filled 2022-09-12: qty 1

## 2022-09-12 MED ORDER — ALUM & MAG HYDROXIDE-SIMETH 200-200-20 MG/5ML PO SUSP
ORAL | Status: AC
  Filled 2022-09-12: qty 30

## 2022-09-12 MED ORDER — ONDANSETRON HCL 4 MG/2ML IJ SOLN
INTRAMUSCULAR | Status: AC
  Filled 2022-09-12: qty 2

## 2022-09-12 MED ORDER — FENTANYL CITRATE (PF) 100 MCG/2ML IJ SOLN
25.0000 ug | INTRAMUSCULAR | Status: AC | PRN
  Administered 2022-09-12 (×4): 25 ug via INTRAVENOUS

## 2022-09-12 MED ORDER — BUPIVACAINE-MELOXICAM ER 200-6 MG/7ML IJ SOLN
INTRAMUSCULAR | Status: DC | PRN
  Administered 2022-09-12: 200 mg

## 2022-09-12 MED ORDER — PROPOFOL 10 MG/ML IV BOLUS
INTRAVENOUS | Status: DC | PRN
  Administered 2022-09-12: 50 mg via INTRAVENOUS

## 2022-09-12 MED ORDER — METOCLOPRAMIDE HCL 5 MG/ML IJ SOLN: 5.0000 mg | Freq: Three times a day (TID) | INTRAMUSCULAR | Status: DC | PRN

## 2022-09-12 MED ORDER — MAGNESIUM CITRATE PO SOLN: 1.0000 | Freq: Once | ORAL | Status: DC | PRN

## 2022-09-12 MED ORDER — KETOROLAC TROMETHAMINE 15 MG/ML IJ SOLN
INTRAMUSCULAR | Status: AC
  Filled 2022-09-12: qty 1

## 2022-09-12 MED ORDER — HYDROCODONE-ACETAMINOPHEN 7.5-325 MG PO TABS
1.0000 | ORAL_TABLET | ORAL | Status: DC | PRN
  Administered 2022-09-14: 1 via ORAL
  Administered 2022-09-14: 2 via ORAL
  Filled 2022-09-12: qty 1
  Filled 2022-09-12: qty 2

## 2022-09-12 MED ORDER — PROPOFOL 500 MG/50ML IV EMUL
INTRAVENOUS | Status: DC | PRN
  Administered 2022-09-12: 100 ug/kg/min via INTRAVENOUS

## 2022-09-12 MED ORDER — MAGNESIUM HYDROXIDE 400 MG/5ML PO SUSP
30.0000 mL | Freq: Every day | ORAL | Status: DC | PRN
  Administered 2022-09-14: 30 mL via ORAL
  Filled 2022-09-12: qty 30

## 2022-09-12 MED ORDER — FENTANYL CITRATE (PF) 100 MCG/2ML IJ SOLN
INTRAMUSCULAR | Status: AC
  Filled 2022-09-12: qty 2

## 2022-09-12 MED ORDER — ROSUVASTATIN CALCIUM 10 MG PO TABS
10.0000 mg | ORAL_TABLET | Freq: Every day | ORAL | Status: DC
  Administered 2022-09-12 – 2022-09-15 (×4): 10 mg via ORAL
  Filled 2022-09-12 (×4): qty 1

## 2022-09-12 MED ORDER — TRANEXAMIC ACID-NACL 1000-0.7 MG/100ML-% IV SOLN
1000.0000 mg | INTRAVENOUS | Status: AC
  Administered 2022-09-12: 1000 mg via INTRAVENOUS

## 2022-09-12 MED ORDER — ACETAMINOPHEN 325 MG PO TABS: 325.0000 mg | ORAL_TABLET | Freq: Four times a day (QID) | ORAL | Status: DC | PRN

## 2022-09-12 MED ORDER — VITAMIN B-12 1000 MCG PO TABS
1000.0000 ug | ORAL_TABLET | Freq: Every day | ORAL | Status: DC
  Administered 2022-09-12 – 2022-09-15 (×4): 1000 ug via ORAL
  Filled 2022-09-12 (×4): qty 1

## 2022-09-12 MED ORDER — DIAZEPAM 2 MG PO TABS: 2.0000 mg | ORAL_TABLET | Freq: Two times a day (BID) | ORAL | Status: DC | PRN

## 2022-09-12 MED ORDER — ASPIRIN 81 MG PO CHEW
81.0000 mg | CHEWABLE_TABLET | Freq: Two times a day (BID) | ORAL | Status: DC
  Administered 2022-09-12 – 2022-09-15 (×6): 81 mg via ORAL
  Filled 2022-09-12 (×6): qty 1

## 2022-09-12 MED ORDER — ACETAMINOPHEN 10 MG/ML IV SOLN: 1000.0000 mg | Freq: Once | INTRAVENOUS | Status: DC | PRN

## 2022-09-12 MED ORDER — BUPIVACAINE-EPINEPHRINE (PF) 0.25% -1:200000 IJ SOLN
INTRAMUSCULAR | Status: DC | PRN
  Administered 2022-09-12: 30 mL

## 2022-09-12 MED ORDER — HYDROCODONE-ACETAMINOPHEN 5-325 MG PO TABS
1.0000 | ORAL_TABLET | ORAL | Status: DC | PRN
  Administered 2022-09-12 – 2022-09-15 (×7): 1 via ORAL
  Filled 2022-09-12 (×7): qty 1

## 2022-09-12 MED ORDER — MORPHINE SULFATE (PF) 2 MG/ML IV SOLN: 0.5000 mg | INTRAVENOUS | Status: DC | PRN

## 2022-09-12 MED ORDER — ONDANSETRON HCL 4 MG/2ML IJ SOLN
INTRAMUSCULAR | Status: DC | PRN
  Administered 2022-09-12: 4 mg via INTRAVENOUS

## 2022-09-12 MED ORDER — METOCLOPRAMIDE HCL 5 MG PO TABS: 5.0000 mg | ORAL_TABLET | Freq: Three times a day (TID) | ORAL | Status: DC | PRN

## 2022-09-12 SURGICAL SUPPLY — 57 items
APL PRP STRL LF DISP 70% ISPRP (MISCELLANEOUS) ×1
BLADE SAGITTAL WIDE XTHICK NO (BLADE) ×2 IMPLANT
BNDG CMPR 5X4 CHSV STRCH STRL (GAUZE/BANDAGES/DRESSINGS) ×2
BNDG COHESIVE 4X5 TAN STRL LF (GAUZE/BANDAGES/DRESSINGS) ×4 IMPLANT
BRUSH SCRUB EZ  4% CHG (MISCELLANEOUS) ×1
BRUSH SCRUB EZ 4% CHG (MISCELLANEOUS) ×2 IMPLANT
CHLORAPREP W/TINT 26 (MISCELLANEOUS) ×2 IMPLANT
COVER HOLE (Hips) IMPLANT
CUP R3 52MM (Hips) IMPLANT
DRAPE 3/4 80X56 (DRAPES) ×2 IMPLANT
DRAPE C-ARM 42X72 X-RAY (DRAPES) ×2 IMPLANT
DRAPE STERI IOBAN 125X83 (DRAPES) IMPLANT
DRAPE U-SHAPE 47X51 STRL (DRAPES) ×2 IMPLANT
DRSG AQUACEL AG ADV 3.5X10 (GAUZE/BANDAGES/DRESSINGS) IMPLANT
DRSG AQUACEL AG ADV 3.5X14 (GAUZE/BANDAGES/DRESSINGS) IMPLANT
ELECT REM PT RETURN 9FT ADLT (ELECTROSURGICAL) ×1
ELECTRODE REM PT RTRN 9FT ADLT (ELECTROSURGICAL) ×2 IMPLANT
GAUZE 4X4 16PLY ~~LOC~~+RFID DBL (SPONGE) ×2 IMPLANT
GAUZE XEROFORM 1X8 LF (GAUZE/BANDAGES/DRESSINGS) IMPLANT
GLOVE BIO SURGEON STRL SZ8 (GLOVE) ×2 IMPLANT
GLOVE BIOGEL PI IND STRL 8.5 (GLOVE) ×4 IMPLANT
GLOVE SURG ORTHO 8.5 STRL (GLOVE) ×2 IMPLANT
GOWN STRL REUS W/ TWL XL LVL3 (GOWN DISPOSABLE) ×4 IMPLANT
GOWN STRL REUS W/TWL XL LVL3 (GOWN DISPOSABLE) ×2
HEAD FEMORAL TAPER 36MM P0 (Head) IMPLANT
HOOD PEEL AWAY FLYTE STAYCOOL (MISCELLANEOUS) ×6 IMPLANT
IV NS 250ML (IV SOLUTION)
IV NS 250ML BAXH (IV SOLUTION) IMPLANT
IV NS IRRIG 3000ML ARTHROMATIC (IV SOLUTION) ×2 IMPLANT
KIT PATIENT CARE HANA TABLE (KITS) ×2 IMPLANT
KIT TURNOVER CYSTO (KITS) ×2 IMPLANT
LINER ACETAB 0 DEG (Liner) IMPLANT
MANIFOLD NEPTUNE II (INSTRUMENTS) ×2 IMPLANT
MAT ABSORB  FLUID 56X50 GRAY (MISCELLANEOUS) ×1
MAT ABSORB FLUID 56X50 GRAY (MISCELLANEOUS) ×2 IMPLANT
NDL SAFETY ECLIP 18X1.5 (MISCELLANEOUS) IMPLANT
NDL SPNL 20GX3.5 QUINCKE YW (NEEDLE) ×2 IMPLANT
NEEDLE HYPO 22GX1.5 SAFETY (NEEDLE) ×2 IMPLANT
NEEDLE SPNL 20GX3.5 QUINCKE YW (NEEDLE) ×1 IMPLANT
PACK HIP PROSTHESIS (MISCELLANEOUS) ×2 IMPLANT
PADDING CAST BLEND 4X4 NS (MISCELLANEOUS) ×4 IMPLANT
PILLOW ABDUCTION MEDIUM (MISCELLANEOUS) ×2 IMPLANT
PULSAVAC PLUS IRRIG FAN TIP (DISPOSABLE) ×1
SCREW CAN HEAD 6.5MM HIP (Screw) IMPLANT
SOLUTION IRRIG SURGIPHOR (IV SOLUTION) ×2 IMPLANT
SPONGE T-LAP 18X18 ~~LOC~~+RFID (SPONGE) ×8 IMPLANT
STAPLER SKIN PROX 35W (STAPLE) ×2 IMPLANT
STEM LATERAL COLLAR POLARSTEM (Stem) IMPLANT
SUT BONE WAX W31G (SUTURE) ×2 IMPLANT
SUT DVC 2 QUILL PDO  T11 36X36 (SUTURE) ×1
SUT DVC 2 QUILL PDO T11 36X36 (SUTURE) ×2 IMPLANT
SUT VIC AB 2-0 CT1 18 (SUTURE) ×2 IMPLANT
SYR 20ML LL LF (SYRINGE) ×2 IMPLANT
TIP FAN IRRIG PULSAVAC PLUS (DISPOSABLE) ×2 IMPLANT
TRAP FLUID SMOKE EVACUATOR (MISCELLANEOUS) ×2 IMPLANT
WAND WEREWOLF FASTSEAL 6.0 (MISCELLANEOUS) ×2 IMPLANT
WATER STERILE IRR 500ML POUR (IV SOLUTION) ×2 IMPLANT

## 2022-09-12 NOTE — Evaluation (Signed)
Physical Therapy Evaluation Patient Details Name: Jillian Taylor MRN: [id number] DOB: 09/13/1951 Today's Date: 09/12/2022  History of Present Illness  Pt is s/p L anterior hip approach on 09/12/22.  Pt has PMH of: blind, BPPV, HTN, morbid obesity.  Clinical Impression  Pt received in Semi-Fowler's position and agreeable to therapy.  Pt able to perform all transfers with good technique and demonstrated good mobility with therapist.  Primary concern is being able to go home and not have a caregiver from 9PM to 11AM.  Pt also will be limited due to lack of vision, as pt is blind.  Pt follows commands very well and is able to navigate the hallways with verbal cues well.  Pt does tend to drift to the L at times and needs consistent verbal cuing, however pt ambulate well and has good weight shift throughout gait training. Due to current uneasiness of being home alone and needing the increased assistance, current discharge plans to SNF are most appropriate at this time.  Therapist anticipates that as pt become more comfortable with walking and stays overnight in hospital, pt may feel more comfortable with going home.  Pt will continue to benefit from skilled therapy in order to address deficits listed below.        Recommendations for follow up therapy are one component of a multi-disciplinary discharge planning process, led by the attending physician.  Recommendations may be updated based on patient status, additional functional criteria and insurance authorization.  Follow Up Recommendations Skilled nursing-short term rehab (<3 hours/day) Can patient physically be transported by private vehicle: Yes    Assistance Recommended at Discharge Frequent or constant Supervision/Assistance  Patient can return home with the following  A little help with walking and/or transfers;A little help with bathing/dressing/bathroom;Assistance with cooking/housework;Assist for transportation;Help with stairs or ramp for  entrance    Equipment Recommendations None recommended by PT  Recommendations for Other Services       Functional Status Assessment Patient has had a recent decline in their functional status and demonstrates the ability to make significant improvements in function in a reasonable and predictable amount of time.     Precautions / Restrictions Precautions Precautions: Anterior Hip Precaution Booklet Issued: Yes (comment) Restrictions Weight Bearing Restrictions: Yes RLE Weight Bearing: Weight bearing as tolerated      Mobility  Bed Mobility Overal bed mobility: Needs Assistance Bed Mobility: Supine to Sit     Supine to sit: Supervision     General bed mobility comments: good mobility    Transfers Overall transfer level: Needs assistance Equipment used: Rolling walker (2 wheels) Transfers: Sit to/from Stand Sit to Stand: Min guard, Min assist           General transfer comment: Pt requires guidance due to being blind, and CGA for safety, but otherwise mobilizes well.    Ambulation/Gait Ambulation/Gait assistance: Min guard, Min assist Gait Distance (Feet): 160 Feet Assistive device: Rolling walker (2 wheels) Gait Pattern/deviations: WFL(Within Functional Limits), Drifts right/left Gait velocity: decreased     General Gait Details: Pt has tendency to drift to the left when ambulating and requires verbal cues for navigating the halls.  Stairs            Wheelchair Mobility    Modified Rankin (Stroke Patients Only)       Balance Overall balance assessment: Needs assistance Sitting-balance support: No upper extremity supported, Feet supported Sitting balance-Leahy Scale: Good     Standing balance support: Bilateral upper extremity supported, During functional  activity Standing balance-Leahy Scale: Fair                               Pertinent Vitals/Pain Pain Assessment Pain Assessment: 0-10 Pain Score: 5  Pain Location: L  Hip Pain Descriptors / Indicators: Aching Pain Intervention(s): Limited activity within patient's tolerance, Monitored during session, Premedicated before session, Repositioned    Home Living Family/patient expects to be discharged to:: Private residence Living Arrangements: Alone Available Help at Discharge: Available PRN/intermittently;Other (Comment);Friend(s) (Pt has 2 aid's but only come at 11 AM and stay until 9 PM.) Type of Home: House Home Access: Stairs to enter;Ramped entrance Entrance Stairs-Rails: Right;Left Entrance Stairs-Number of Steps: 5   Home Layout: One level Home Equipment: Conservation officer, nature (2 wheels);Cane - single point;BSC/3in1;Shower seat;Grab bars - tub/shower      Prior Function Prior Level of Function : Needs assist       Physical Assist : Mobility (physical)             Hand Dominance   Dominant Hand: Right    Extremity/Trunk Assessment   Upper Extremity Assessment Upper Extremity Assessment: Generalized weakness    Lower Extremity Assessment Lower Extremity Assessment: Generalized weakness       Communication   Communication: No difficulties  Cognition Arousal/Alertness: Awake/alert Behavior During Therapy: WFL for tasks assessed/performed Overall Cognitive Status: Within Functional Limits for tasks assessed                                          General Comments      Exercises     Assessment/Plan    PT Assessment Patient needs continued PT services  PT Problem List Decreased strength;Decreased range of motion;Decreased activity tolerance;Decreased balance;Decreased mobility;Obesity       PT Treatment Interventions DME instruction;Gait training;Stair training;Functional mobility training;Therapeutic activities;Therapeutic exercise;Balance training;Neuromuscular re-education    PT Goals (Current goals can be found in the Care Plan section)  Acute Rehab PT Goals Patient Stated Goal: to go home if  possible/safe to do so. PT Goal Formulation: With patient Time For Goal Achievement: 09/26/22 Potential to Achieve Goals: Fair    Frequency BID     Co-evaluation               AM-PAC PT "6 Clicks" Mobility  Outcome Measure Help needed turning from your back to your side while in a flat bed without using bedrails?: A Little Help needed moving from lying on your back to sitting on the side of a flat bed without using bedrails?: A Little Help needed moving to and from a bed to a chair (including a wheelchair)?: A Little Help needed standing up from a chair using your arms (e.g., wheelchair or bedside chair)?: A Little Help needed to walk in hospital room?: A Little Help needed climbing 3-5 steps with a railing? : A Lot 6 Click Score: 17    End of Session Equipment Utilized During Treatment: Gait belt Activity Tolerance: Patient tolerated treatment well Patient left: in chair;with call bell/phone within reach;with chair alarm set;with family/visitor present Nurse Communication: Mobility status PT Visit Diagnosis: Unsteadiness on feet (R26.81);Muscle weakness (generalized) (M62.81);Difficulty in walking, not elsewhere classified (R26.2)    Time: 4081-4481 PT Time Calculation (min) (ACUTE ONLY): 39 min   Charges:   PT Evaluation $PT Eval Moderate Complexity: 1 Mod PT Treatments $Gait  Training: 23-37 mins        Gwenlyn Saran, [place], DPT 09/12/22, 4:48 PM

## 2022-09-12 NOTE — Anesthesia Preprocedure Evaluation (Addendum)
Anesthesia Evaluation  Patient identified by MRN, date of birth, ID band Patient awake    Reviewed: Allergy & Precautions, NPO status , Patient's Chart, lab work & pertinent test results  History of Anesthesia Complications Negative for: history of anesthetic complications  Airway Mallampati: II  TM Distance: >3 FB Neck ROM: Full    Dental  (+) Partial Upper, Partial Lower   Pulmonary asthma , neg sleep apnea, neg COPD, Patient abstained from smoking.Not current smoker, former smoker,  Well controlled asthma   Pulmonary exam normal breath sounds clear to auscultation       Cardiovascular Exercise Tolerance: Good METShypertension, (-) CAD and (-) Past MI (-) dysrhythmias  Rhythm:Regular Rate:Normal - Systolic murmurs    Neuro/Psych PSYCHIATRIC DISORDERS Depression blind    GI/Hepatic neg GERD  ,(+)     (-) substance abuse  ,   Endo/Other  neg diabetes  Renal/GU negative Renal ROS     Musculoskeletal   Abdominal (+) + obese,   Peds  Hematology  (+) REFUSES BLOOD PRODUCTS,   Anesthesia Other Findings Past Medical History: 11/08/2015: Allergic rhinoconjunctivitis 07/02/2007: Alopecia areata     Comment:  Qualifier: Diagnosis of  By: Audria Nine MD, Britta Mccreedy   07/02/2007: ANXIETY DEPRESSION     Comment:  Qualifier: Diagnosis of  By: Audria Nine MD, Britta Mccreedy   No date: Arthritis 12/08/2011: Aseptic necrosis of head or neck of femur No date: Asthma 07/02/2007: BACK PAIN, LUMBAR, CHRONIC     Comment:  Annotation: 5 mm retrolisthesis L5 on L4 Qualifier:               Diagnosis of  By: Audria Nine MD, Barbara   No date: Blind 04/15/2020: BPPV (benign paroxysmal positional vertigo), right 03/24/2019: Chest discomfort 07/02/2007: CONSTIPATION, RECURRENT     Comment:  Qualifier: Diagnosis of  By: Audria Nine MD, Barbara   No date: Eczema 03/24/2019: Essential hypertension 04/15/2020: Hearing loss No date:  Hypertension 07/02/2007: LPRD (laryngopharyngeal reflux disease)     Comment:  Qualifier: Diagnosis of  By: Audria Nine MD, Britta Mccreedy   07/02/2007: MENOPAUSAL SYNDROME     Comment:  Qualifier: Diagnosis of  By: Audria Nine MD, Barbara   07/02/2007: Mild intermittent asthma     Comment:  Annotation: Possible Reactive airway disease Qualifier:               Diagnosis of  By: Audria Nine MD, Barbara   03/24/2019: Mixed dyslipidemia 12/08/2011: Morbid obesity (HCC) 10/28/2020: Obesity (BMI 35.0-39.9 without comorbidity) 03/24/2019: Overweight 07/02/2007: PULMONARY NODULE     Comment:  Qualifier: History of  By: Audria Nine MD, Britta Mccreedy   07/02/2007: TOBACCO USE, QUIT     Comment:  Qualifier: Diagnosis of  By: Audria Nine MD, Britta Mccreedy   07/02/2007: UVEITIS     Comment:  Annotation: Hogt-Koyanagi-Harada Syndrome Qualifier:               Diagnosis of  By: Audria Nine MD, Barbara    Reproductive/Obstetrics                            Anesthesia Physical Anesthesia Plan  ASA: 3  Anesthesia Plan: Spinal   Post-op Pain Management: Ofirmev IV (intra-op)*   Induction: Intravenous  PONV Risk Score and Plan: 2 and Ondansetron, Dexamethasone, Propofol infusion, TIVA and Treatment may vary due to age or medical condition  Airway Management Planned: Natural Airway  Additional Equipment: None  Intra-op Plan:   Post-operative Plan:   Informed Consent: I have reviewed the patients History  and Physical, chart, labs and discussed the procedure including the risks, benefits and alternatives for the proposed anesthesia with the patient or authorized representative who has indicated his/her understanding and acceptance.       Plan Discussed with: CRNA and Surgeon  Anesthesia Plan Comments: (Discussed R/B/A of neuraxial anesthesia technique with patient: - rare risks of spinal/epidural hematoma, nerve damage, infection - Risk of PDPH - Risk of nausea and vomiting - Risk of conversion  to general anesthesia and its associated risks, including sore throat, damage to lips/eyes/teeth/oropharynx, and rare risks such as cardiac and respiratory events. - Risk of allergic reactions  Discussed the role of CRNA in patient's perioperative care.  Patient has listed allergy to PCN - hives/rash Severe blistering skin reaction (SJS/TEN)? no Liver or kidney injury caused by PCN? no Hemolytic anemia from PCN? no Drug fever? no Painful swollen joints? no Severe reaction involving inside of mouth, eye, or genital ulcers? no Based on current evidence Alfonse Alpers et al, J Allergy Clin Immunol Pract, 2019), will proceed with cefazolin use: Yes    Patient voiced understanding.)       Anesthesia Quick Evaluation

## 2022-09-12 NOTE — Op Note (Signed)
09/12/2022  11:26 AM  PATIENT:  Jillian Taylor   MRN: [id number]  PRE-OPERATIVE DIAGNOSIS:  Osteoarthritis left hip   POST-OPERATIVE DIAGNOSIS: Same  Procedure: Left Total Hip Replacement  Surgeon: Elyn Aquas. Harlow Mares, MD   Assist: Carlynn Spry, PA-C  Anesthesia: Spinal   EBL: 100 mL   Specimens: None   Drains: None   Components used: A size 2 lateral Polarstem Smith and Nephew, R3 size 52 mm shell, and a 36 mm +0 mm head    Description of the procedure in detail: After informed consent was obtained and the appropriate extremity marked in the pre-operative holding area, the patient was taken to the operating room and placed in the supine position on the fracture table. All pressure points were well padded and bilateral lower extremities were place in traction spars. The hip was prepped and draped in standard sterile fashion. A spinal anesthetic had been delivered by the anesthesia team. The skin and subcutaneous tissues were injected with a mixture of Marcaine with epinephrine for post-operative pain. A longitudinal incision approximately 10 cm in length was carried out from the anterior superior iliac spine to the greater trochanter. The tensor fascia was divided and blunt dissection was taken down to the level of the joint capsule. The lateral circumflex vessels were cauterized. Deep retractors were placed and a portion of the anterior capsule was excised. Using fluoroscopy the neck cut was planned and carried out with a sagittal saw. The head was passed from the field with use of a corkscrew and hip skid. Deep retractors were placed along the acetabulum and the degenerative labrum and large osteophytes were removed with a Rongeur. The cup was sequentially reamed to a size 52 mm. The wound was irrigated and using fluoroscopy the size 52 mm cup was impacted in to anatomic position. A single screw was placed followed by a threaded hole cover. The final liner was impacted in to position.  Attention was then turned to the proximal femur. The leg was placed in extension and external rotation. The canal was opened and sequentially broached to a size 2 lateral. The trial components were placed and the hip relocated. The components were found to be in good position using fluoroscopy. The hip was dislocated and the trial components removed. The final components were impacted in to position and the hip relocated. The final components were again check with fluoroscopy and found to be in good position. Hemostasis was achieved with electrocautery. The deep capsule was injected with Marcaine and epinephrine. The wound was irrigated with bacitracin laced normal saline and the tensor fascia closed with #2 Quill suture. The subcutaneous tissues were closed with 2-0 vicryl and staples for the skin. A sterile dressing was applied and an abduction pillow. Patient tolerated the procedure well and there were no apparent complication. Patient was taken to the recovery room in good condition.   Kurtis Bushman, MD

## 2022-09-12 NOTE — H&P (Signed)
The patient has been re-examined, and the chart reviewed, and there have been no interval changes to the documented history and physical.  Plan a left total hip today.  Anesthesia is not consulted regarding a peripheral nerve block for post-operative pain.  The risks, benefits, and alternatives have been discussed at length, and the patient is willing to proceed.    

## 2022-09-12 NOTE — Anesthesia Procedure Notes (Signed)
Spinal  Patient location during procedure: OR Start time: 09/12/2022 9:50 AM End time: 09/12/2022 9:59 AM Reason for block: surgical anesthesia Staffing Performed: resident/CRNA and other anesthesia staff  Anesthesiologist: Arita Miss, MD Resident/CRNA: Hedda Slade, CRNA Other anesthesia staff: Everardo Pacific, RN Performed by: Hedda Slade, CRNA Authorized by: Arita Miss, MD   Preanesthetic Checklist Completed: patient identified, IV checked, site marked, risks and benefits discussed, surgical consent, monitors and equipment checked, pre-op evaluation and timeout performed Spinal Block Patient position: sitting Prep: ChloraPrep Patient monitoring: heart rate, continuous pulse ox, blood pressure and cardiac monitor Approach: midline Location: L4-5 Injection technique: single-shot Needle Needle type: Whitacre and Introducer  Needle gauge: 24 G Needle length: 9 cm Assessment Events: CSF return Additional Notes Sterile aseptic technique used throughout the procedure.  Negative paresthesia. Negative blood return. Positive free-flowing CSF. Expiration date of kit checked and confirmed. Patient tolerated procedure well, without complications.  First attempt by SRNA at L3-4. Unable to advance spinal needle past os.

## 2022-09-12 NOTE — Transfer of Care (Signed)
Immediate Anesthesia Transfer of Care Note  Patient: Jillian Taylor  Procedure(s) Performed: TOTAL HIP ARTHROPLASTY ANTERIOR APPROACH (Left: Hip)  Patient Location: PACU  Anesthesia Type:Spinal  Level of Consciousness: awake, alert  and oriented  Airway & Oxygen Therapy: Patient Spontanous Breathing  Post-op Assessment: Report given to RN and Post -op Vital signs reviewed and stable  Post vital signs: Reviewed and stable  Last Vitals:  Vitals Value Taken Time  BP 103/63 09/12/22 1142  Temp 36.3 C 09/12/22 1139  Pulse 63 09/12/22 1142  Resp 17 09/12/22 1142  SpO2 100 % 09/12/22 1142  Vitals shown include unvalidated device data.  Last Pain:  Vitals:   09/12/22 1139  TempSrc: Tympanic  PainSc:       Patients Stated Pain Goal: 0 (27/25/36 6440)  Complications: No notable events documented.

## 2022-09-13 ENCOUNTER — Encounter: Payer: Self-pay | Admitting: Orthopedic Surgery

## 2022-09-13 DIAGNOSIS — M87852 Other osteonecrosis, left femur: Secondary | ICD-10-CM | POA: Diagnosis not present

## 2022-09-13 DIAGNOSIS — M1612 Unilateral primary osteoarthritis, left hip: Secondary | ICD-10-CM | POA: Diagnosis not present

## 2022-09-13 DIAGNOSIS — J45909 Unspecified asthma, uncomplicated: Secondary | ICD-10-CM | POA: Diagnosis not present

## 2022-09-13 DIAGNOSIS — I10 Essential (primary) hypertension: Secondary | ICD-10-CM | POA: Diagnosis not present

## 2022-09-13 NOTE — TOC Progression Note (Signed)
Transition of Care [facility]) - Progression Note    Patient Details  Name: Jillian Taylor MRN: [id number] Date of Birth: 01-05-1951  Transition of Care [facility]) CM/SW [place], RN Phone Number: 09/13/2022, 2:18 PM  Clinical Narrative:     Spoke with the patient and she is agreeable to a bedsearch to go to [facility] SN[facility] [place] sent, Fl2 completed, PASSR obtained       Expected Discharge Plan and Services                                                 Social Determinants of Health (SDOH) Interventions    Readmission Risk Interventions     No data to display

## 2022-09-13 NOTE — Progress Notes (Signed)
Physical Therapy Treatment Patient Details Name: Jillian Taylor MRN: [id number] DOB: 05-Oct-1951 Today's Date: 09/13/2022   History of Present Illness Pt is s/p L anterior hip approach on 09/12/22.  Pt has PMH of: blind, BPPV, HTN, morbid obesity.    PT Comments    Pt was exiting the BR upon arriving. She is alert and oriented and agreeable to PT session. Session focused on improving gait kinematics and stair training. Overall pt is progressing well. She is extremely motivated. Was able to ambulate to stairs and perform 1 x. Her vision, pain, and strength deficits make her unsafe to return home alone. Highly recommend DC to SNF to address deficits while maximizing independence with ADLs.    Recommendations for follow up therapy are one component of a multi-disciplinary discharge planning process, led by the attending physician.  Recommendations may be updated based on patient status, additional functional criteria and insurance authorization.  Follow Up Recommendations  Skilled nursing-short term rehab (<3 hours/day)     Assistance Recommended at Discharge Frequent or constant Supervision/Assistance  Patient can return home with the following A little help with walking and/or transfers;A little help with bathing/dressing/bathroom;Assistance with cooking/housework;Assist for transportation;Help with stairs or ramp for entrance   Equipment Recommendations  None recommended by PT       Precautions / Restrictions Precautions Precautions: Anterior Hip Precaution Booklet Issued: Yes (comment) Restrictions Weight Bearing Restrictions: Yes RLE Weight Bearing: Weight bearing as tolerated     Mobility  Bed Mobility Overal bed mobility: Needs Assistance Bed Mobility: Sit to Supine  Supine to sit: Min assist, Mod assist Sit to supine: Min assist   General bed mobility comments: pt required min assist + use of non op LE to progress operative LE back into bed    Transfers Overall  transfer level: Needs assistance Equipment used: Rolling walker (2 wheels) Transfers: Sit to/from Stand Sit to Stand: Min guard  General transfer comment: CGA for safety with tactle cues for handplacement. vision continues to limit session progression    Ambulation/Gait Ambulation/Gait assistance: Min guard, Supervision Gait Distance (Feet): 175 Feet Assistive device: Rolling walker (2 wheels) Gait Pattern/deviations: Antalgic Gait velocity: decreased  General Gait Details: Pt was able to ambulate ~ 175 ft with RW. Need vcs for navigating throughout due to vision.   Stairs Stairs: Yes Stairs assistance: Min guard Stair Management: One rail Left, Step to pattern, Forwards Number of Stairs: 4 General stair comments: Pt was able to safely ascend/descend 4 stair without physical assistance. she does however require tactle and vsc throughout   Balance Overall balance assessment: Needs assistance Sitting-balance support: No upper extremity supported, Feet supported Sitting balance-Leahy Scale: Good     Standing balance support: Bilateral upper extremity supported, During functional activity Standing balance-Leahy Scale: Fair      Cognition Arousal/Alertness: Awake/alert Behavior During Therapy: WFL for tasks assessed/performed Overall Cognitive Status: Within Functional Limits for tasks assessed    General Comments: Pt is A and O x 4. Vision deficits greatly impacts pt's independence currently           General Comments General comments (skin integrity, edema, etc.): pt was in BR. Author will return later to progress pt with out of room ambulation      Pertinent Vitals/Pain Pain Assessment Pain Assessment: 0-10 Pain Score: 5  Pain Location: L Hip Pain Descriptors / Indicators: Aching Pain Intervention(s): Limited activity within patient's tolerance, Monitored during session, Premedicated before session, Repositioned     PT Goals (current goals can  now be found in the  care plan section) Acute Rehab PT Goals Patient Stated Goal: rehab then home Progress towards PT goals: Progressing toward goals    Frequency    BID      PT Plan Current plan remains appropriate       AM-PAC PT "6 Clicks" Mobility   Outcome Measure  Help needed turning from your back to your side while in a flat bed without using bedrails?: A Little Help needed moving from lying on your back to sitting on the side of a flat bed without using bedrails?: A Little Help needed moving to and from a bed to a chair (including a wheelchair)?: A Little Help needed standing up from a chair using your arms (e.g., wheelchair or bedside chair)?: A Little Help needed to walk in hospital room?: A Little Help needed climbing 3-5 steps with a railing? : A Little 6 Click Score: 18    End of Session   Activity Tolerance: Patient tolerated treatment well Patient left: in bed;with call bell/phone within reach;with bed alarm set;with family/visitor present;with SCD's reapplied Nurse Communication: Mobility status PT Visit Diagnosis: Unsteadiness on feet (R26.81);Muscle weakness (generalized) (M62.81);Difficulty in walking, not elsewhere classified (R26.2)     Time: 1975-8832 PT Time Calculation (min) (ACUTE ONLY): 25 min  Charges:  $Gait Training: 23-37 mins                     Julaine Fusi PTA 09/13/22, 3:24 PM

## 2022-09-13 NOTE — Progress Notes (Signed)
Physical Therapy Treatment Patient Details Name: Jillian Taylor MRN: [id number] DOB: [birth year] Today's Date: 09/13/2022   History of Present Illness Pt is s/p L anterior hip approach on 09/12/22.  Pt has PMH of: blind, BPPV, HTN, morbid obesity.    PT Comments    Pt was long sitting in bed upon arriving. She is A and O x 4 and extremely pleasant throughout. Her baseline vision is poor, but she was Independent at baseline, but does require extensive assistance due to blindness for most ADLs this morning. She was able to exit bed, stand, and ambulate short distance with RW. Requires assistance for all to safely be completed. RN aware of pt's request for pain medication post session.Will return this afternoon to progress ambulation, strength, and functional mobility per current POC.Pt will greatly benefit from continued skilled PT at SNF  to address deficits while maximizing independence with ADLs.    Recommendations for follow up therapy are one component of a multi-disciplinary discharge planning process, led by the attending physician.  Recommendations may be updated based on patient status, additional functional criteria and insurance authorization.  Follow Up Recommendations  Skilled nursing-short term rehab (<3 hours/day)     Assistance Recommended at Discharge Frequent or constant Supervision/Assistance  Patient can return home with the following A little help with walking and/or transfers;A little help with bathing/dressing/bathroom;Assistance with cooking/housework;Assist for transportation;Help with stairs or ramp for entrance   Equipment Recommendations  None recommended by PT       Precautions / Restrictions Precautions Precautions: Anterior Hip Precaution Booklet Issued: Yes (comment) Restrictions Weight Bearing Restrictions: Yes RLE Weight Bearing: Weight bearing as tolerated     Mobility  Bed Mobility Overal bed mobility: Needs Assistance Bed Mobility: Supine to  Sit  Supine to sit: Min assist, Mod assist  General bed mobility comments: Pt required more assistance to exit bed today. Vcs and tactle cues throughout. increased time due to pain.    Transfers Overall transfer level: Needs assistance Equipment used: Rolling walker (2 wheels) Transfers: Sit to/from Stand Sit to Stand: Min assist    General transfer comment: min assist from lowest bed height    Ambulation/Gait Ambulation/Gait assistance: Min assist Gait Distance (Feet): 8 Feet Assistive device: Rolling walker (2 wheels) Gait Pattern/deviations: WFL(Within Functional Limits), Drifts right/left Gait velocity: decreased     General Gait Details: Pt was able to ambulate ~ 8 ft with RW. no LOB but does require assistance to safely navigate RW around room. Pt uses only a cane at baseline however currently unable due to pain.    Balance Overall balance assessment: Needs assistance Sitting-balance support: No upper extremity supported, Feet supported Sitting balance-Leahy Scale: Good     Standing balance support: Bilateral upper extremity supported, During functional activity Standing balance-Leahy Scale: Fair       Cognition Arousal/Alertness: Awake/alert Behavior During Therapy: WFL for tasks assessed/performed Overall Cognitive Status: Within Functional Limits for tasks assessed      General Comments: Pt is A and O x 4. Vision deficits greatly impacts pt's independence currently           General Comments General comments (skin integrity, edema, etc.): pt required alot of assistance for ADLs due to vision deficits. Assisted with getting pt's parials and drink and pancake call bell.      Pertinent Vitals/Pain Pain Assessment Pain Assessment: 0-10 Pain Score: 6  Pain Location: L Hip Pain Descriptors / Indicators: Aching Pain Intervention(s): Limited activity within patient's tolerance, Monitored during session, Premedicated  before session, Repositioned, Ice applied      PT Goals (current goals can now be found in the care plan section) Acute Rehab PT Goals Patient Stated Goal: rehab then home Progress towards PT goals: Progressing toward goals    Frequency    BID      PT Plan Current plan remains appropriate       AM-PAC PT "6 Clicks" Mobility   Outcome Measure  Help needed turning from your back to your side while in a flat bed without using bedrails?: A Little Help needed moving from lying on your back to sitting on the side of a flat bed without using bedrails?: A Little Help needed moving to and from a bed to a chair (including a wheelchair)?: A Little Help needed standing up from a chair using your arms (e.g., wheelchair or bedside chair)?: A Little Help needed to walk in hospital room?: A Little Help needed climbing 3-5 steps with a railing? : A Lot 6 Click Score: 17    End of Session   Activity Tolerance: Patient tolerated treatment well Patient left: in chair;with call bell/phone within reach;with chair alarm set;with family/visitor present Nurse Communication: Mobility status PT Visit Diagnosis: Unsteadiness on feet (R26.81);Muscle weakness (generalized) (M62.81);Difficulty in walking, not elsewhere classified (R26.2)     Time: 7001-7494 PT Time Calculation (min) (ACUTE ONLY): 28 min  Charges:  $Gait Training: 8-22 mins $Therapeutic Activity: 8-22 mins                     Jetta Lout PTA 09/13/22, 9:56 AM

## 2022-09-13 NOTE — NC FL2 (Signed)
[place] MEDICAID FL2 LEVEL OF CARE SCREENING TOOL     IDENTIFICATION  Patient Name: Jillian Taylor Birthdate: 08/14/1951 Sex: female Admission Date (Current Location): 09/12/2022  [facility] and [place] Number:  Chiropodist and Address:  [facility], [address] [place], [place] [address]      Provider Number: [id number]  Attending Physician Name and Address:  Lyndle Herrlich, MD  Relative Name and Phone Number:  Morrie Sheldon, sister [phone]    Current Level of Care: Hospital Recommended Level of Care: Skilled Nursing Facility Prior Approval Number:    Date Approved/Denied:   PASRR Number: [id number]  Discharge Plan: SNF    Current Diagnoses: Patient Active Problem List   Diagnosis Date Noted   History of total hip replacement, left 09/12/2022   History of total hip replacement 09/12/2022   Hypertension    Obesity (BMI 35.0-39.9 without comorbidity) 10/28/2020   Arthritis    Asthma    Eczema    Blind 04/15/2020   BPPV (benign paroxysmal positional vertigo), right 04/15/2020   Hearing loss 04/15/2020   Essential hypertension 03/24/2019   Overweight 03/24/2019   Chest discomfort 03/24/2019   Mixed dyslipidemia 03/24/2019   Allergic rhinoconjunctivitis 11/08/2015   Aseptic necrosis of head or neck of femur 12/08/2011   Morbid obesity (HCC) 12/08/2011   ANXIETY DEPRESSION 07/02/2007   UVEITIS 07/02/2007   Mild intermittent asthma 07/02/2007   PULMONARY NODULE 07/02/2007   CONSTIPATION, RECURRENT 07/02/2007   MENOPAUSAL SYNDROME 07/02/2007   ALOPECIA AREATA 07/02/2007   BACK PAIN, LUMBAR, CHRONIC 07/02/2007   LPRD (laryngopharyngeal reflux disease) 07/02/2007   TOBACCO USE, QUIT 07/02/2007    Orientation RESPIRATION BLADDER Height & Weight     Self, Time, Situation, Place  Normal Continent, External catheter Weight: 110.7 kg Height:  5' 5.75" (167 cm)  BEHAVIORAL SYMPTOMS/MOOD NEUROLOGICAL BOWEL NUTRITION STATUS       Continent Diet (See DC summary)  AMBULATORY STATUS COMMUNICATION OF NEEDS Skin   Extensive Assist Verbally Normal, Surgical wounds                       Personal Care Assistance Level of Assistance  Bathing, Dressing, Feeding Bathing Assistance: Maximum assistance Feeding assistance: Limited assistance Dressing Assistance: Maximum assistance     Functional Limitations Info             SPECIAL CARE FACTORS FREQUENCY  PT (By licensed PT), OT (By licensed OT)     PT Frequency: 5 times per week OT Frequency: 5 times per week            Contractures Contractures Info: Not present    Additional Factors Info  Code Status, Allergies Code Status Info: full code Allergies Info: Methotrexate, Methotrexate Derivatives, Penicillins, Ciprofloxacin           Current Medications (09/13/2022):  This is the current hospital active medication list Current Facility-Administered Medications  Medication Dose Route Frequency Provider Last Rate Last Admin   acetaminophen (TYLENOL) tablet 325-650 mg  325-650 mg Oral Q6H PRN Lyndle Herrlich, MD       albuterol (PROVENTIL) (2.5 MG/3ML) 0.083% nebulizer solution 2.5 mg  2.5 mg Nebulization Q6H PRN Lyndle Herrlich, MD       albuterol (VENTOLIN HFA) 108 (90 Base) MCG/ACT inhaler 2 puff  2 puff Inhalation Q4H PRN Lyndle Herrlich, MD       alum & mag hydroxide-simeth (MAALOX/MYLANTA) 200-200-20 MG/5ML suspension 30 mL  30 mL Oral  Q4H PRN Lovell Sheehan, MD   30 mL at 09/12/22 1345   amLODipine (NORVASC) tablet 2.5 mg  2.5 mg Oral Daily Lovell Sheehan, MD   2.5 mg at 09/13/22 8315   aspirin chewable tablet 81 mg  81 mg Oral BID Lovell Sheehan, MD   81 mg at 09/13/22 0850   bisacodyl (DULCOLAX) suppository 10 mg  10 mg Rectal Daily PRN Lovell Sheehan, MD       cyanocobalamin (VITAMIN B12) tablet 1,000 mcg  1,000 mcg Oral Daily Lovell Sheehan, MD   1,000 mcg at 09/13/22 0850   diazepam (VALIUM) tablet 2 mg  2 mg Oral Q12H PRN Lovell Sheehan, MD       docusate sodium (COLACE) capsule 100 mg  100 mg Oral BID Lovell Sheehan, MD   100 mg at 09/13/22 0850   fluticasone (FLONASE) 50 MCG/ACT nasal spray 1 spray  1 spray Each Nare Daily Lovell Sheehan, MD   1 spray at 09/13/22 1308   HYDROcodone-acetaminophen (NORCO) 7.5-325 MG per tablet 1-2 tablet  1-2 tablet Oral Q4H PRN Lovell Sheehan, MD       HYDROcodone-acetaminophen (NORCO/VICODIN) 5-325 MG per tablet 1-2 tablet  1-2 tablet Oral Q4H PRN Lovell Sheehan, MD   1 tablet at 09/13/22 1321   lactated ringers infusion   Intravenous Continuous Lovell Sheehan, MD 75 mL/hr at 09/13/22 0455 Infusion Verify at 09/13/22 0455   loratadine (CLARITIN) tablet 10 mg  10 mg Oral Daily PRN Lovell Sheehan, MD       magnesium citrate solution 1 Bottle  1 Bottle Oral Once PRN Lovell Sheehan, MD       magnesium hydroxide (MILK OF MAGNESIA) suspension 30 mL  30 mL Oral Daily PRN Lovell Sheehan, MD       menthol-cetylpyridinium (CEPACOL) lozenge 3 mg  1 lozenge Oral PRN Lovell Sheehan, MD       Or   phenol (CHLORASEPTIC) mouth spray 1 spray  1 spray Mouth/Throat PRN Lovell Sheehan, MD       metoCLOPramide (REGLAN) tablet 5-10 mg  5-10 mg Oral Q8H PRN Lovell Sheehan, MD       Or   metoCLOPramide (REGLAN) injection 5-10 mg  5-10 mg Intravenous Q8H PRN Lovell Sheehan, MD       morphine (PF) 2 MG/ML injection 0.5-1 mg  0.5-1 mg Intravenous Q2H PRN Lovell Sheehan, MD       ondansetron [facility]) tablet 4 mg  4 mg Oral Q6H PRN Lovell Sheehan, MD       Or   ondansetron [facility]) injection 4 mg  4 mg Intravenous Q6H PRN Lovell Sheehan, MD       prednisoLONE acetate (PRED FORTE) 1 % ophthalmic suspension 1 drop  1 drop Both Eyes BID Lovell Sheehan, MD       rosuvastatin (CRESTOR) tablet 10 mg  10 mg Oral Daily Lovell Sheehan, MD   10 mg at 09/13/22 1761     Discharge Medications: Please see discharge summary for a list of discharge medications.  Relevant Imaging Results:  Relevant Lab  Results:   Additional Information SS# [id number]  Conception Oms, RN

## 2022-09-13 NOTE — Anesthesia Postprocedure Evaluation (Signed)
Anesthesia Post Note  Patient: Jillian Taylor  Procedure(s) Performed: TOTAL HIP ARTHROPLASTY ANTERIOR APPROACH (Left: Hip)  Patient location during evaluation: Nursing Unit Anesthesia Type: Spinal Level of consciousness: oriented and awake and alert Pain management: pain level controlled Vital Signs Assessment: post-procedure vital signs reviewed and stable Respiratory status: spontaneous breathing and respiratory function stable Cardiovascular status: blood pressure returned to baseline and stable Postop Assessment: no headache, no backache, no apparent nausea or vomiting and patient able to bend at knees Anesthetic complications: no   No notable events documented.   Last Vitals:  Vitals:   09/13/22 0000 09/13/22 0415  BP: (!) 119/56 124/66  Pulse: 75 68  Resp: 18 20  Temp: 36.4 C 36.8 C  SpO2: 100% 99%    Last Pain:  Vitals:   09/13/22 0456  TempSrc:   PainSc: [place]

## 2022-09-13 NOTE — Progress Notes (Signed)
Patient Aquacel dressing saturated with blood, ABD pads applied x2.

## 2022-09-13 NOTE — Plan of Care (Signed)

## 2022-09-13 NOTE — Progress Notes (Signed)
  Subjective:  Patient reports pain as moderate.    Objective:   VITALS:   Vitals:   09/12/22 1555 09/12/22 2022 09/13/22 0000 09/13/22 0415  BP: (!) 141/64 (!) 123/49 (!) 119/56 124/66  Pulse: (!) 58 63 75 68  Resp: 17 18 18 20   Temp: 98 F (36.7 C) 98.5 F (36.9 C) 97.6 F (36.4 C) 98.3 F (36.8 C)  TempSrc:    Oral  SpO2: 100% 100% 100% 99%  Weight:      Height:        PHYSICAL EXAM:  Neurologically intact ABD soft Neurovascular intact Sensation intact distally Intact pulses distally Dorsiflexion/Plantar flexion intact Incision: moderate drainage and drsg changed No cellulitis present Compartment soft  LABS  No results found for this or any previous visit (from the past 24 hour(s)).  DG HIP UNILAT WITH PELVIS 1V LEFT  Result Date: 09/12/2022 CLINICAL DATA:  Left hip arthroplasty. EXAM: DG HIP (WITH OR WITHOUT PELVIS) 1V*L* COMPARISON:  CT abdomen and pelvis 02/26/2021 FINDINGS: Images were performed intraoperatively without the presence of a radiologist. New total left hip arthroplasty. No hardware complication is seen. Total fluoroscopy images: 3 Total fluoroscopy time: 7 seconds Total dose: Radiation Exposure Index (as provided by the fluoroscopic device): 1.231 mGy air Kerma Please see intraoperative findings for further detail. IMPRESSION: Intraoperative fluoroscopy for total left hip arthroplasty. Electronically Signed   By: Yvonne Kendall M.D.   On: 09/12/2022 11:58   DG C-Arm 1-60 Min-No Report  Result Date: 09/12/2022 Fluoroscopy was utilized by the requesting physician.  No radiographic interpretation.    Assessment/Plan: 1 Day Post-Op   Principal Problem:   History of total hip replacement, left Active Problems:   History of total hip replacement   Advance diet Up with therapy Discharge to SNF when bed available   Carlynn Spry , PA-C 09/13/2022, 8:02 AM

## 2022-09-14 DIAGNOSIS — M1612 Unilateral primary osteoarthritis, left hip: Secondary | ICD-10-CM | POA: Diagnosis not present

## 2022-09-14 DIAGNOSIS — M87852 Other osteonecrosis, left femur: Secondary | ICD-10-CM | POA: Diagnosis not present

## 2022-09-14 DIAGNOSIS — J45909 Unspecified asthma, uncomplicated: Secondary | ICD-10-CM | POA: Diagnosis not present

## 2022-09-14 DIAGNOSIS — I10 Essential (primary) hypertension: Secondary | ICD-10-CM | POA: Diagnosis not present

## 2022-09-14 LAB — SURGICAL PATHOLOGY

## 2022-09-14 MED ORDER — HYDROCODONE-ACETAMINOPHEN 5-325 MG PO TABS: 1.0000 | ORAL_TABLET | ORAL | 0 refills | Status: AC | PRN

## 2022-09-14 MED ORDER — ASPIRIN 81 MG PO CHEW: 81.0000 mg | CHEWABLE_TABLET | Freq: Two times a day (BID) | ORAL | 0 refills | Status: DC

## 2022-09-14 MED ORDER — BISACODYL 5 MG PO TBEC: 5.0000 mg | DELAYED_RELEASE_TABLET | Freq: Every day | ORAL | 0 refills | Status: AC | PRN

## 2022-09-14 MED ORDER — BISACODYL 5 MG PO TBEC
5.0000 mg | DELAYED_RELEASE_TABLET | Freq: Every day | ORAL | Status: DC | PRN
  Administered 2022-09-14: 5 mg via ORAL
  Filled 2022-09-14: qty 1

## 2022-09-14 NOTE — Progress Notes (Signed)
Physical Therapy Treatment Patient Details Name: Jillian Taylor MRN: [id number] DOB: [birth year] Today's Date: 09/14/2022   History of Present Illness Pt is s/p L anterior hip approach on 09/12/22.  Pt has PMH of: blind, BPPV, HTN, morbid obesity.    PT Comments    Pt was sitting in recliner upon arriving requesting to use BR. Stood and ambulated to BR with RW prior to ambulating in hallway. Once returned to room, assisted pt with returning to bed prior to performing ther ex in bed. Overall pt is progressing but still requiring assistance that she will not have at home. Recommend DC to SNF to maximize independence prior to returning home.    Recommendations for follow up therapy are one component of a multi-disciplinary discharge planning process, led by the attending physician.  Recommendations may be updated based on patient status, additional functional criteria and insurance authorization.  Follow Up Recommendations  Skilled nursing-short term rehab (<3 hours/day)     Assistance Recommended at Discharge Frequent or constant Supervision/Assistance  Patient can return home with the following A little help with walking and/or transfers;A little help with bathing/dressing/bathroom;Assistance with cooking/housework;Assist for transportation;Help with stairs or ramp for entrance   Equipment Recommendations  None recommended by PT       Precautions / Restrictions Precautions Precautions: Anterior Hip Precaution Booklet Issued: Yes (comment) Restrictions Weight Bearing Restrictions: Yes RLE Weight Bearing: Weight bearing as tolerated     Mobility  Bed Mobility Overal bed mobility: Needs Assistance Bed Mobility: Supine to Sit, Sit to Supine  Supine to sit: Min assist Sit to supine: Min assist  General bed mobility comments: In recliner pre session. Min-mod assist required to safely return to supine after OOB activity.    Transfers Overall transfer level: Needs  assistance Equipment used: Rolling walker (2 wheels) Transfers: Sit to/from Stand Sit to Stand: Min guard    General transfer comment: CGA for safety to stand from recliner and from toilet. Vcs for handplacement, and technique improvements    Ambulation/Gait Ambulation/Gait assistance: Min guard Gait Distance (Feet): 120 Feet Assistive device: Rolling walker (2 wheels) Gait Pattern/deviations: Step-through pattern Gait velocity: decreased     General Gait Details: pt was able to ambulate 120 ft with RW.    Balance Overall balance assessment: Needs assistance Sitting-balance support: No upper extremity supported, Feet supported Sitting balance-Leahy Scale: Good     Standing balance support: Bilateral upper extremity supported, During functional activity Standing balance-Leahy Scale: Fair      Cognition Arousal/Alertness: Awake/alert Behavior During Therapy: WFL for tasks assessed/performed Overall Cognitive Status: Within Functional Limits for tasks assessed    General Comments: Pt is A and O x 4. Vision deficits greatly impacts pt's independence currently               Pertinent Vitals/Pain Pain Assessment Pain Assessment: 0-10 Pain Score: 4  Pain Location: L Hip Pain Descriptors / Indicators: Aching Pain Intervention(s): Limited activity within patient's tolerance, Monitored during session, Premedicated before session, Repositioned, Ice applied     PT Goals (current goals can now be found in the care plan section) Acute Rehab PT Goals Patient Stated Goal: rehab then home Progress towards PT goals: Progressing toward goals    Frequency    BID      PT Plan Current plan remains appropriate       AM-PAC PT "6 Clicks" Mobility   Outcome Measure  Help needed turning from your back to your side while in a flat bed without  using bedrails?: A Little Help needed moving from lying on your back to sitting on the side of a flat bed without using bedrails?: A  Little Help needed moving to and from a bed to a chair (including a wheelchair)?: A Little Help needed standing up from a chair using your arms (e.g., wheelchair or bedside chair)?: A Little Help needed to walk in hospital room?: A Little Help needed climbing 3-5 steps with a railing? : A Little 6 Click Score: 18    End of Session Equipment Utilized During Treatment: Gait belt Activity Tolerance: Patient tolerated treatment well Patient left: in chair;with call bell/phone within reach;with chair alarm set Nurse Communication: Mobility status PT Visit Diagnosis: Unsteadiness on feet (R26.81);Muscle weakness (generalized) (M62.81);Difficulty in walking, not elsewhere classified (R26.2)     Time: [id number] PT Time Calculation (min) (ACUTE ONLY): 28 min  Charges:  $Gait Training: 8-22 mins $Therapeutic Exercise: 8-22 mins $Therapeutic Activity: 8-22 mins                    Julaine Fusi PTA 09/14/22, 4:01 PM

## 2022-09-14 NOTE — TOC Progression Note (Signed)
Transition of Care [facility]) - Progression Note    Patient Details  Name: Jillian Taylor MRN: [id number] Date of Birth: [birth year]  Transition of Care [facility]) CM/SW [place], RN Phone Number: 09/14/2022, 2:38 PM  Clinical Narrative:     Ins was approved to go to [employer],  [id number], 9/21-9-25 Notified the physician        Expected Discharge Plan and Services                                                 Social Determinants of Health (SDOH) Interventions    Readmission Risk Interventions     No data to display

## 2022-09-14 NOTE — Progress Notes (Signed)
  Subjective:  Patient reports pain as mild to moderate.    Objective:   VITALS:   Vitals:   09/13/22 0814 09/13/22 1711 09/13/22 1944 09/14/22 0435  BP: 123/66 124/71 (!) 145/53 (!) 142/68  Pulse: 77 89 84 94  Resp: 16 16 15 18   Temp: 97.7 F (36.5 C) 99.7 F (37.6 C) 99.7 F (37.6 C) 98.6 F (37 C)  TempSrc: Oral  Oral   SpO2: 100% 98% 98% 100%  Weight:      Height:        PHYSICAL EXAM:  Neurologically intact ABD soft Neurovascular intact Sensation intact distally Intact pulses distally Dorsiflexion/Plantar flexion intact Incision: dressing C/D/I No cellulitis present Compartment soft  LABS  No results found for this or any previous visit (from the past 24 hour(s)).  DG HIP UNILAT WITH PELVIS 1V LEFT  Result Date: 09/12/2022 CLINICAL DATA:  Left hip arthroplasty. EXAM: DG HIP (WITH OR WITHOUT PELVIS) 1V*L* COMPARISON:  CT abdomen and pelvis 02/26/2021 FINDINGS: Images were performed intraoperatively without the presence of a radiologist. New total left hip arthroplasty. No hardware complication is seen. Total fluoroscopy images: 3 Total fluoroscopy time: 7 seconds Total dose: Radiation Exposure Index (as provided by the fluoroscopic device): 1.231 mGy air Kerma Please see intraoperative findings for further detail. IMPRESSION: Intraoperative fluoroscopy for total left hip arthroplasty. Electronically Signed   By: Yvonne Kendall M.D.   On: 09/12/2022 11:58   DG C-Arm 1-60 Min-No Report  Result Date: 09/12/2022 Fluoroscopy was utilized by the requesting physician.  No radiographic interpretation.    Assessment/Plan: 2 Days Post-Op   Principal Problem:   History of total hip replacement, left Active Problems:   History of total hip replacement   Advance diet Up with therapy Discharge to SNF when bed available RX in chart Follow up in 2 weeks in office. Call to confirm appointment [phone] WBAT LLE   Carlynn Spry , PA-C 09/14/2022, 6:55 AM

## 2022-09-14 NOTE — Care Management CC44 (Signed)
Condition Code 44 Documentation Completed  Patient Details  Name: Jillian Taylor MRN: [id number] Date of Birth: 09/23/1951   Condition Code 44 given:  Yes Patient signature on Condition Code 44 notice:  Yes Documentation of 2 MD's agreement:  Yes Code 44 added to claim:  Yes    Conception Oms, RN 09/14/2022, 8:36 AM

## 2022-09-14 NOTE — Progress Notes (Signed)
Physical Therapy Treatment Patient Details Name: Jillian Taylor MRN: [id number] DOB: 1951-03-18 Today's Date: 09/14/2022   History of Present Illness Pt is s/p L anterior hip approach on 09/12/22.  Pt has PMH of: blind, BPPV, HTN, morbid obesity.    PT Comments    Pt was long sitting in bed upon arriving. Pt agrees to session and was pre-medicated prior. She required min assist to exit bed. Stood and ambulated with RW ~ 150 ft however does require vcs and tactile cues due to pt's vision deficits. Overall tolerated ambulation well. Once pt returned to room, reviewed HEP and pt performed without difficulty. Recommend DC to SNF to address deficits while maximizing independence with ADLs. Pt lives alone and still requiring assistance with ADLs.    Recommendations for follow up therapy are one component of a multi-disciplinary discharge planning process, led by the attending physician.  Recommendations may be updated based on patient status, additional functional criteria and insurance authorization.  Follow Up Recommendations  Skilled nursing-short term rehab (<3 hours/day)     Assistance Recommended at Discharge Frequent or constant Supervision/Assistance  Patient can return home with the following A little help with walking and/or transfers;A little help with bathing/dressing/bathroom;Assistance with cooking/housework;Assist for transportation;Help with stairs or ramp for entrance   Equipment Recommendations  None recommended by PT       Precautions / Restrictions Precautions Precautions: Anterior Hip Precaution Booklet Issued: Yes (comment) Restrictions Weight Bearing Restrictions: Yes RLE Weight Bearing: Weight bearing as tolerated     Mobility  Bed Mobility Overal bed mobility: Needs Assistance Bed Mobility: Supine to Sit, Sit to Supine  Supine to sit: Min assist Sit to supine: Min assist       Transfers Overall transfer level: Needs assistance Equipment used: Rolling  walker (2 wheels) Transfers: Sit to/from Stand Sit to Stand: Min guard    General transfer comment: CGA for safety    Ambulation/Gait Ambulation/Gait assistance: Min guard Gait Distance (Feet): 150 Feet Assistive device: Rolling walker (2 wheels) Gait Pattern/deviations: Step-through pattern Gait velocity: decreased     General Gait Details: pt was able to ambulate 150 ft with RW with increased time + CGA. Vcs throughout for naigating RW due to vision deficits    Balance Overall balance assessment: Needs assistance Sitting-balance support: No upper extremity supported, Feet supported Sitting balance-Leahy Scale: Good     Standing balance support: Bilateral upper extremity supported, During functional activity Standing balance-Leahy Scale: Fair     Cognition Arousal/Alertness: Awake/alert Behavior During Therapy: WFL for tasks assessed/performed Overall Cognitive Status: Within Functional Limits for tasks assessed      General Comments: Pt is A and O x 4. Vision deficits greatly impacts pt's independence currently               Pertinent Vitals/Pain Pain Assessment Pain Assessment: 0-10 Pain Score: 4  Pain Location: L Hip Pain Descriptors / Indicators: Aching Pain Intervention(s): Limited activity within patient's tolerance, Monitored during session, Premedicated before session, Repositioned     PT Goals (current goals can now be found in the care plan section) Acute Rehab PT Goals Patient Stated Goal: rehab then home Progress towards PT goals: Progressing toward goals    Frequency    BID      PT Plan Current plan remains appropriate       AM-PAC PT "6 Clicks" Mobility   Outcome Measure  Help needed turning from your back to your side while in a flat bed without using bedrails?: A  Little Help needed moving from lying on your back to sitting on the side of a flat bed without using bedrails?: A Little Help needed moving to and from a bed to a chair  (including a wheelchair)?: A Little Help needed standing up from a chair using your arms (e.g., wheelchair or bedside chair)?: A Little Help needed to walk in hospital room?: A Little Help needed climbing 3-5 steps with a railing? : A Little 6 Click Score: 18    End of Session Equipment Utilized During Treatment: Gait belt Activity Tolerance: Patient tolerated treatment well Patient left: in chair;with call bell/phone within reach;with chair alarm set Nurse Communication: Mobility status PT Visit Diagnosis: Unsteadiness on feet (R26.81);Muscle weakness (generalized) (M62.81);Difficulty in walking, not elsewhere classified (R26.2)     Time: 7939-0300 PT Time Calculation (min) (ACUTE ONLY): 33 min  Charges:  $Gait Training: 8-22 mins $Therapeutic Activity: 8-22 mins                     Jetta Lout PTA 09/14/22, 3:53 PM

## 2022-09-14 NOTE — Discharge Instructions (Signed)

## 2022-09-14 NOTE — Plan of Care (Signed)
  Problem: Education: Goal: Knowledge of the prescribed therapeutic regimen will improve Outcome: Progressing Goal: Understanding of discharge needs will improve Outcome: Progressing Goal: Individualized Educational Video(s) Outcome: Progressing   Problem: Activity: Goal: Ability to avoid complications of mobility impairment will improve Outcome: Progressing Goal: Ability to tolerate increased activity will improve Outcome: Progressing   Problem: Clinical Measurements: Goal: Postoperative complications will be avoided or minimized Outcome: Progressing   Problem: Pain Management: Goal: Pain level will decrease with appropriate interventions Outcome: Progressing   Problem: Skin Integrity: Goal: Will show signs of wound healing Outcome: Progressing   Problem: Education: Goal: Knowledge of General Education information will improve Description: Including pain rating scale, medication(s)/side effects and non-pharmacologic comfort measures Outcome: Progressing   Problem: Health Behavior/Discharge Planning: Goal: Ability to manage health-related needs will improve Outcome: Progressing   Problem: Clinical Measurements: Goal: Ability to maintain clinical measurements within normal limits will improve Outcome: Progressing Goal: Will remain free from infection Outcome: Progressing   Problem: Activity: Goal: Risk for activity intolerance will decrease Outcome: Progressing   Problem: Elimination: Goal: Will not experience complications related to bowel motility Outcome: Progressing Goal: Will not experience complications related to urinary retention Outcome: Progressing   Problem: Pain Managment: Goal: General experience of comfort will improve Outcome: Progressing   Problem: Safety: Goal: Ability to remain free from injury will improve Outcome: Progressing   Problem: Clinical Measurements: Goal: Diagnostic test results will improve Outcome: Adequate for  Discharge Goal: Respiratory complications will improve Outcome: Adequate for Discharge Goal: Cardiovascular complication will be avoided Outcome: Adequate for Discharge   Problem: Nutrition: Goal: Adequate nutrition will be maintained Outcome: Adequate for Discharge   Problem: Coping: Goal: Level of anxiety will decrease Outcome: Adequate for Discharge

## 2022-09-15 DIAGNOSIS — M1612 Unilateral primary osteoarthritis, left hip: Secondary | ICD-10-CM | POA: Diagnosis not present

## 2022-09-15 DIAGNOSIS — W19XXXA Unspecified fall, initial encounter: Secondary | ICD-10-CM | POA: Diagnosis not present

## 2022-09-15 DIAGNOSIS — Z7982 Long term (current) use of aspirin: Secondary | ICD-10-CM | POA: Diagnosis not present

## 2022-09-15 DIAGNOSIS — I10 Essential (primary) hypertension: Secondary | ICD-10-CM | POA: Diagnosis not present

## 2022-09-15 DIAGNOSIS — J4520 Mild intermittent asthma, uncomplicated: Secondary | ICD-10-CM | POA: Diagnosis not present

## 2022-09-15 DIAGNOSIS — K5900 Constipation, unspecified: Secondary | ICD-10-CM | POA: Diagnosis not present

## 2022-09-15 DIAGNOSIS — M87852 Other osteonecrosis, left femur: Secondary | ICD-10-CM | POA: Diagnosis not present

## 2022-09-15 DIAGNOSIS — Z96642 Presence of left artificial hip joint: Secondary | ICD-10-CM | POA: Diagnosis not present

## 2022-09-15 DIAGNOSIS — H548 Legal blindness, as defined in USA: Secondary | ICD-10-CM | POA: Diagnosis not present

## 2022-09-15 DIAGNOSIS — E538 Deficiency of other specified B group vitamins: Secondary | ICD-10-CM | POA: Diagnosis not present

## 2022-09-15 DIAGNOSIS — Z471 Aftercare following joint replacement surgery: Secondary | ICD-10-CM | POA: Diagnosis not present

## 2022-09-15 DIAGNOSIS — Z7401 Bed confinement status: Secondary | ICD-10-CM | POA: Diagnosis not present

## 2022-09-15 DIAGNOSIS — H8110 Benign paroxysmal vertigo, unspecified ear: Secondary | ICD-10-CM | POA: Diagnosis not present

## 2022-09-15 DIAGNOSIS — J45909 Unspecified asthma, uncomplicated: Secondary | ICD-10-CM | POA: Diagnosis not present

## 2022-09-15 DIAGNOSIS — L639 Alopecia areata, unspecified: Secondary | ICD-10-CM | POA: Diagnosis not present

## 2022-09-15 DIAGNOSIS — K5904 Chronic idiopathic constipation: Secondary | ICD-10-CM | POA: Diagnosis not present

## 2022-09-15 DIAGNOSIS — Z87891 Personal history of nicotine dependence: Secondary | ICD-10-CM | POA: Diagnosis not present

## 2022-09-15 DIAGNOSIS — E782 Mixed hyperlipidemia: Secondary | ICD-10-CM | POA: Diagnosis not present

## 2022-09-15 DIAGNOSIS — J309 Allergic rhinitis, unspecified: Secondary | ICD-10-CM | POA: Diagnosis not present

## 2022-09-15 DIAGNOSIS — Z743 Need for continuous supervision: Secondary | ICD-10-CM | POA: Diagnosis not present

## 2022-09-15 DIAGNOSIS — E559 Vitamin D deficiency, unspecified: Secondary | ICD-10-CM | POA: Diagnosis not present

## 2022-09-15 DIAGNOSIS — M1632 Unilateral osteoarthritis resulting from hip dysplasia, left hip: Secondary | ICD-10-CM | POA: Diagnosis not present

## 2022-09-15 DIAGNOSIS — R6889 Other general symptoms and signs: Secondary | ICD-10-CM | POA: Diagnosis not present

## 2022-09-15 DIAGNOSIS — K219 Gastro-esophageal reflux disease without esophagitis: Secondary | ICD-10-CM | POA: Diagnosis not present

## 2022-09-15 NOTE — Progress Notes (Signed)
Report called to Amy, RN @ [facility].

## 2022-09-15 NOTE — Progress Notes (Signed)
EMS here to transport pt. 

## 2022-09-15 NOTE — Progress Notes (Signed)
Physical Therapy Treatment Patient Details Name: Jillian Taylor MRN: [id number] DOB: 06/03/1951 Today's Date: 09/15/2022   History of Present Illness Pt is s/p L anterior hip approach on 09/12/22.  Pt has PMH of: blind, BPPV, HTN, morbid obesity.    PT Comments    Pt was sitting in recliner upon arriving. She endorses "its sore" but is agreeable to PT session. Pt was able to stand and ambulate with RW + assistance with navigation due to vision deficits. PT lives alone and still requiring assistance to perform most ADLs. PT recommends DC to SNF to address deficits while maximizing independence prior to pt returning home alone.    Recommendations for follow up therapy are one component of a multi-disciplinary discharge planning process, led by the attending physician.  Recommendations may be updated based on patient status, additional functional criteria and insurance authorization.  Follow Up Recommendations  Skilled nursing-short term rehab (<3 hours/day)     Assistance Recommended at Discharge Frequent or constant Supervision/Assistance  Patient can return home with the following A little help with walking and/or transfers;A little help with bathing/dressing/bathroom;Assistance with cooking/housework;Assist for transportation;Help with stairs or ramp for entrance   Equipment Recommendations  None recommended by PT       Precautions / Restrictions Precautions Precautions: Anterior Hip Precaution Booklet Issued: Yes (comment) Restrictions Weight Bearing Restrictions: Yes RLE Weight Bearing: Weight bearing as tolerated     Mobility  Bed Mobility Overal bed mobility: Needs Assistance Bed Mobility: Sit to Supine  Sit to supine: Min assist   General bed mobility comments: In recliner pre session however min assist to return to long sitting in bed from EOB short sit. Vcs for technique improvements throughout    Transfers Overall transfer level: Needs assistance Equipment used:  Rolling walker (2 wheels) Transfers: Sit to/from Stand Sit to Stand: Min guard    General transfer comment: CGA for safety + tactile cues for handplacement due to vision deficits    Ambulation/Gait Ambulation/Gait assistance: Min guard Gait Distance (Feet): 200 Feet Assistive device: Rolling walker (2 wheels) Gait Pattern/deviations: Step-through pattern Gait velocity: decreased     General Gait Details: pt was able to tolerate ambulation 200 ft with RW. IMproved overall steop through pattern however remains antalgic with several standing rest to relieve UE fatigue     Balance Overall balance assessment: Needs assistance Sitting-balance support: No upper extremity supported, Feet supported Sitting balance-Leahy Scale: Good     Standing balance support: Bilateral upper extremity supported, During functional activity Standing balance-Leahy Scale: Fair     Cognition Arousal/Alertness: Awake/alert Behavior During Therapy: WFL for tasks assessed/performed Overall Cognitive Status: Within Functional Limits for tasks assessed    General Comments: Pt is A and O x 4. Vision deficits greatly impacts pt's independence currently           General Comments General comments (skin integrity, edema, etc.): pt endorses performing ther ex previously instructed to perform without assistance. Author encouraged pt continue to perform.      Pertinent Vitals/Pain Pain Assessment Pain Assessment: 0-10 Pain Score: 7  Pain Location: L Hip Pain Descriptors / Indicators: Aching Pain Intervention(s): Other (comment) (pt did not want pain medicine until after working with PT)     PT Goals (current goals can now be found in the care plan section) Acute Rehab PT Goals Patient Stated Goal: rehab then home Progress towards PT goals: Progressing toward goals    Frequency    BID      PT Plan  Current plan remains appropriate       AM-PAC PT "6 Clicks" Mobility   Outcome Measure  Help  needed turning from your back to your side while in a flat bed without using bedrails?: A Little Help needed moving from lying on your back to sitting on the side of a flat bed without using bedrails?: A Little Help needed moving to and from a bed to a chair (including a wheelchair)?: A Little Help needed standing up from a chair using your arms (e.g., wheelchair or bedside chair)?: A Little Help needed to walk in hospital room?: A Little Help needed climbing 3-5 steps with a railing? : A Little 6 Click Score: 18    End of Session   Activity Tolerance: Patient tolerated treatment well Patient left: in bed;with call bell/phone within reach;with bed alarm set Nurse Communication: Mobility status;Patient requests pain meds PT Visit Diagnosis: Unsteadiness on feet (R26.81);Muscle weakness (generalized) (M62.81);Difficulty in walking, not elsewhere classified (R26.2)     Time: 2376-2831 PT Time Calculation (min) (ACUTE ONLY): 15 min  Charges:  $Gait Training: 8-22 mins                     Jetta Lout PTA 09/15/22, 11:30 AM

## 2022-09-15 NOTE — Plan of Care (Signed)

## 2022-09-15 NOTE — TOC Progression Note (Signed)
Transition of Care [facility]) - Progression Note    Patient Details  Name: Jillian Taylor MRN: [id number] Date of Birth: 1951/01/25  Transition of Care [facility]) CM/SW [place], RN Phone Number: 09/15/2022, 1:42 PM  Clinical Narrative:    The patient is to go To [facility] [place], they patient's sister is aware EMS to transport Called ems to transport There are 2 ahead of her         Expected Discharge Plan and Services           Expected Discharge Date: 09/15/22                                     Social Determinants of Health (SDOH) Interventions    Readmission Risk Interventions     No data to display

## 2022-09-15 NOTE — Discharge Summary (Addendum)
Physician Discharge Summary  Patient ID: Jillian Taylor MRN: [id number] DOB/AGE: Nov 29, 1951 71 y.o.  Admit date: 09/12/2022 Discharge date: 09/15/2022  Admission Diagnoses:  M87.852 Other osteonecrosis, left femur M16.12 Unilateral primary osteoarthritis, left hip History of total hip replacement, left  Discharge Diagnoses:  M87.852 Other osteonecrosis, left femur M16.12 Unilateral primary osteoarthritis, left hip Principal Problem:   History of total hip replacement, left Active Problems:   History of total hip replacement   Past Medical History:  Diagnosis Date   Allergic rhinoconjunctivitis 11/08/2015   Alopecia areata 07/02/2007   Qualifier: Diagnosis of  By: Leward Quan MD, Pamala Hurry     ANXIETY DEPRESSION 07/02/2007   Qualifier: Diagnosis of  By: Leward Quan MD, Pamala Hurry     Arthritis    Aseptic necrosis of head or neck of femur 12/08/2011   Asthma    BACK PAIN, LUMBAR, CHRONIC 07/02/2007   Annotation: 5 mm retrolisthesis L5 on L4 Qualifier: Diagnosis of  By: Leward Quan MD, Barbara     Blind    BPPV (benign paroxysmal positional vertigo), right 04/15/2020   Chest discomfort 03/24/2019   CONSTIPATION, RECURRENT 07/02/2007   Qualifier: Diagnosis of  By: Leward Quan MD, Pamala Hurry     Eczema    Essential hypertension 03/24/2019   Hearing loss 04/15/2020   Hypertension    LPRD (laryngopharyngeal reflux disease) 07/02/2007   Qualifier: Diagnosis of  By: Leward Quan MD, Berdie Ogren SYNDROME 07/02/2007   Qualifier: Diagnosis of  By: Leward Quan MD, Barbara     Mild intermittent asthma 07/02/2007   Annotation: Possible Reactive airway disease Qualifier: Diagnosis of  By: Leward Quan MD, Barbara     Mixed dyslipidemia 03/24/2019   Morbid obesity ([place]) 12/08/2011   Obesity (BMI 35.0-39.9 without comorbidity) 10/28/2020   Overweight 03/24/2019   PULMONARY NODULE 07/02/2007   Qualifier: History of  By: Leward Quan MD, Carla Drape USE, QUIT 07/02/2007   Qualifier:  Diagnosis of  By: Leward Quan MD, Pamala Hurry     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 female who was admitted 09/12/2022 with a diagnosis of  M87.852 Other osteonecrosis, left femur M16.12 Unilateral primary osteoarthritis, left hip History of total hip replacement, left and went to the operating room on 09/12/2022 and underwent the above named procedures.    She was given perioperative antibiotics:  Anti-infectives (From admission, onward)    Start     Dose/Rate Route Frequency Ordered Stop   09/12/22 1700  ceFAZolin (ANCEF) IVPB 2g/100 mL premix        2 g 200 mL/hr over 30 Minutes Intravenous Every 6 hours 09/12/22 1553 09/12/22 2323   09/12/22 0600  ceFAZolin (ANCEF) IVPB 2g/100 mL premix        2 g 200 mL/hr over 30 Minutes Intravenous On call to O.R. 09/12/22 0127 09/12/22 1318     .  She was given sequential compression devices, early ambulation, and aspirin for DVT prophylaxis.  She benefited maximally from the hospital stay and there were no complications.    Recent vital signs:  Vitals:   09/15/22 0018 09/15/22 0808  BP: 121/62 112/69  Pulse: 84 86  Resp: 17 18  Temp:  98.6 F (37 C)  SpO2: 97% 100%    Recent laboratory studies:  Lab Results  Component Value Date   HGB 11.5 (L) 09/04/2022   HGB 12.3 09/15/2021   HGB 12.0 02/26/2021   Lab Results  Component Value Date   WBC 10.3 09/04/2022   PLT 181 09/04/2022   No results found for: "INR" Lab Results  Component Value Date   NA 139 09/04/2022   K 3.8 09/04/2022   CL 105 09/04/2022   CO2 26 09/04/2022   BUN 24 (H) 09/04/2022   CREATININE 0.80 09/04/2022   GLUCOSE 93 09/04/2022    Discharge Medications:   Allergies as of 09/15/2022        Reactions   Methotrexate Hives   Methotrexate Derivatives Hives   Penicillins Hives   TOLERATED CEFAZOLIN Has patient had a PCN reaction causing immediate rash, facial/tongue/throat swelling, SOB or lightheadedness with hypotension: Yes Has patient had a PCN reaction causing severe rash involving mucus membranes or skin necrosis: Yes Has patient had a PCN reaction that required hospitalization No Has patient had a PCN reaction occurring within the last 10 years: No If all of the above answers are "NO", then may proceed with Cephalosporin use.   Ciprofloxacin Nausea And Vomiting   Reported by patient         Medication List     TAKE these medications    albuterol 108 (90 Base) MCG/ACT inhaler Commonly known as: VENTOLIN HFA Inhale 2 puffs into the lungs every 4 (four) hours as needed for wheezing or shortness of breath.   albuterol 0.63 MG/3ML nebulizer solution Commonly known as: ACCUNEB Take 1 ampule by nebulization every 6 (six) hours as needed for wheezing.   amLODipine 2.5 MG tablet Commonly known as: NORVASC TAKE 1 TABLET BY MOUTH EVERY DAY   aspirin 81 MG chewable tablet Chew 1 tablet (81 mg total) by mouth 2 (two) times daily.   bisacodyl 5 MG EC tablet Commonly known as: DULCOLAX Take 1 tablet (5 mg total) by mouth daily as needed for moderate constipation.   celecoxib 200 MG capsule Commonly known as: CELEBREX Take 200 mg by mouth in the morning, at noon, and at bedtime.   CENTRUM SILVER 50+WOMEN PO Take 1 tablet by mouth daily.   cetirizine HCl 1 MG/ML solution Commonly known as: ZYRTEC Take 10 mg by mouth daily as needed for allergies (allergies).   Co Q 10 100 MG Caps Take 1 capsule by mouth daily.   cyanocobalamin 1000 MCG tablet Commonly known as: VITAMIN B12 Take 1,000 mcg by mouth daily.   diazepam 2 MG tablet Commonly known as: VALIUM Take 2 mg by mouth every 12 (twelve) hours as needed for anxiety.   EQL Vitamin D3 50 MCG (2000 UT)  Caps Generic drug: Cholecalciferol Take 1 capsule by mouth daily.   Fluocinolone Acetonide 0.01 % Oil Place 5 drops into the left ear in the morning and at bedtime.   fluticasone 50 MCG/ACT nasal spray Commonly known as: FLONASE Place 1 spray into both nostrils in the morning and at bedtime.   HYDROcodone-acetaminophen 5-325 MG tablet Commonly known as: NORCO/VICODIN Take 1 tablet by mouth every 4 (four) hours as needed for moderate pain (pain score 4-6).   lidocaine 5 % Commonly known as: LIDODERM Place 1 patch onto the skin daily as needed. Remove & Discard patch within 12 hours or as directed by MD   Pred Forte 1 % ophthalmic suspension Generic drug: prednisoLONE acetate Place 1 drop into both eyes 2 (two) times daily.  1 drop in the right eye  3 times daily   rosuvastatin 10 MG tablet Commonly known as: CRESTOR Take 10 mg by mouth daily.   UNABLE TO FIND Take 1 capsule by mouth daily. [place] (magnesium 225 mg with zinc 7.5 mg and vitamin D3 25 mcg)               Durable Medical Equipment  (From admission, onward)           Start     Ordered   09/14/22 0659  For home use only DME 3 n 1  Once        09/14/22 0658   09/14/22 0659  For home use only DME Walker rolling  Once       Question Answer Comment  Walker: With 5 Inch Wheels   Patient needs a walker to treat with the following condition Osteoarthritis of left hip      09/14/22 0658            Diagnostic Studies: DG HIP UNILAT WITH PELVIS 1V LEFT  Result Date: 09/12/2022 CLINICAL DATA:  Left hip arthroplasty. EXAM: DG HIP (WITH OR WITHOUT PELVIS) 1V*L* COMPARISON:  CT abdomen and pelvis 02/26/2021 FINDINGS: Images were performed intraoperatively without the presence of a radiologist. New total left hip arthroplasty. No hardware complication is seen. Total fluoroscopy images: 3 Total fluoroscopy time: 7 seconds Total dose: Radiation Exposure Index (as provided by the fluoroscopic device): 1.231 mGy  air Kerma Please see intraoperative findings for further detail. IMPRESSION: Intraoperative fluoroscopy for total left hip arthroplasty. Electronically Signed   By: Neita Garnet M.D.   On: 09/12/2022 11:58   DG C-Arm 1-60 Min-No Report  Result Date: 09/12/2022 Fluoroscopy was utilized by the requesting physician.  No radiographic interpretation.    Disposition: Discharge disposition: 03-Skilled Nursing Facility            Signed: Altamese Cabal ,PA-C 09/15/2022, 12:07 PM   Patient ready for discharge to skilled nursing.

## 2022-09-18 DIAGNOSIS — E782 Mixed hyperlipidemia: Secondary | ICD-10-CM | POA: Diagnosis not present

## 2022-09-18 DIAGNOSIS — J309 Allergic rhinitis, unspecified: Secondary | ICD-10-CM | POA: Diagnosis not present

## 2022-09-18 DIAGNOSIS — K219 Gastro-esophageal reflux disease without esophagitis: Secondary | ICD-10-CM | POA: Diagnosis not present

## 2022-09-18 DIAGNOSIS — J4520 Mild intermittent asthma, uncomplicated: Secondary | ICD-10-CM | POA: Diagnosis not present

## 2022-09-18 DIAGNOSIS — K5904 Chronic idiopathic constipation: Secondary | ICD-10-CM | POA: Diagnosis not present

## 2022-09-18 DIAGNOSIS — M1632 Unilateral osteoarthritis resulting from hip dysplasia, left hip: Secondary | ICD-10-CM | POA: Diagnosis not present

## 2022-09-18 DIAGNOSIS — I10 Essential (primary) hypertension: Secondary | ICD-10-CM | POA: Diagnosis not present

## 2022-09-22 DIAGNOSIS — E782 Mixed hyperlipidemia: Secondary | ICD-10-CM | POA: Diagnosis not present

## 2022-09-22 DIAGNOSIS — K219 Gastro-esophageal reflux disease without esophagitis: Secondary | ICD-10-CM | POA: Diagnosis not present

## 2022-09-22 DIAGNOSIS — M1632 Unilateral osteoarthritis resulting from hip dysplasia, left hip: Secondary | ICD-10-CM | POA: Diagnosis not present

## 2022-09-22 DIAGNOSIS — J4520 Mild intermittent asthma, uncomplicated: Secondary | ICD-10-CM | POA: Diagnosis not present

## 2022-09-22 DIAGNOSIS — K5904 Chronic idiopathic constipation: Secondary | ICD-10-CM | POA: Diagnosis not present

## 2022-09-22 DIAGNOSIS — J309 Allergic rhinitis, unspecified: Secondary | ICD-10-CM | POA: Diagnosis not present

## 2022-09-22 DIAGNOSIS — I10 Essential (primary) hypertension: Secondary | ICD-10-CM | POA: Diagnosis not present

## 2022-09-25 DIAGNOSIS — H548 Legal blindness, as defined in USA: Secondary | ICD-10-CM | POA: Diagnosis not present

## 2022-09-25 DIAGNOSIS — Z96642 Presence of left artificial hip joint: Secondary | ICD-10-CM | POA: Diagnosis not present

## 2022-09-25 DIAGNOSIS — I10 Essential (primary) hypertension: Secondary | ICD-10-CM | POA: Diagnosis not present

## 2022-09-25 DIAGNOSIS — E782 Mixed hyperlipidemia: Secondary | ICD-10-CM | POA: Diagnosis not present

## 2022-09-25 DIAGNOSIS — Z471 Aftercare following joint replacement surgery: Secondary | ICD-10-CM | POA: Diagnosis not present

## 2022-09-25 DIAGNOSIS — J4520 Mild intermittent asthma, uncomplicated: Secondary | ICD-10-CM | POA: Diagnosis not present

## 2022-09-25 LAB — NO BLOOD PRODUCTS

## 2022-09-27 DIAGNOSIS — I10 Essential (primary) hypertension: Secondary | ICD-10-CM | POA: Diagnosis not present

## 2022-09-27 DIAGNOSIS — H548 Legal blindness, as defined in USA: Secondary | ICD-10-CM | POA: Diagnosis not present

## 2022-09-27 DIAGNOSIS — E782 Mixed hyperlipidemia: Secondary | ICD-10-CM | POA: Diagnosis not present

## 2022-09-27 DIAGNOSIS — J4520 Mild intermittent asthma, uncomplicated: Secondary | ICD-10-CM | POA: Diagnosis not present

## 2022-09-27 DIAGNOSIS — Z96642 Presence of left artificial hip joint: Secondary | ICD-10-CM | POA: Diagnosis not present

## 2022-09-27 DIAGNOSIS — Z471 Aftercare following joint replacement surgery: Secondary | ICD-10-CM | POA: Diagnosis not present

## 2022-09-29 DIAGNOSIS — M87052 Idiopathic aseptic necrosis of left femur: Secondary | ICD-10-CM | POA: Diagnosis not present

## 2022-10-04 DIAGNOSIS — E782 Mixed hyperlipidemia: Secondary | ICD-10-CM | POA: Diagnosis not present

## 2022-10-04 DIAGNOSIS — Z96642 Presence of left artificial hip joint: Secondary | ICD-10-CM | POA: Diagnosis not present

## 2022-10-04 DIAGNOSIS — I10 Essential (primary) hypertension: Secondary | ICD-10-CM | POA: Diagnosis not present

## 2022-10-04 DIAGNOSIS — H548 Legal blindness, as defined in USA: Secondary | ICD-10-CM | POA: Diagnosis not present

## 2022-10-04 DIAGNOSIS — J4520 Mild intermittent asthma, uncomplicated: Secondary | ICD-10-CM | POA: Diagnosis not present

## 2022-10-04 DIAGNOSIS — Z471 Aftercare following joint replacement surgery: Secondary | ICD-10-CM | POA: Diagnosis not present

## 2022-10-05 DIAGNOSIS — J4520 Mild intermittent asthma, uncomplicated: Secondary | ICD-10-CM | POA: Diagnosis not present

## 2022-10-05 DIAGNOSIS — I10 Essential (primary) hypertension: Secondary | ICD-10-CM | POA: Diagnosis not present

## 2022-10-05 DIAGNOSIS — Z471 Aftercare following joint replacement surgery: Secondary | ICD-10-CM | POA: Diagnosis not present

## 2022-10-05 DIAGNOSIS — E782 Mixed hyperlipidemia: Secondary | ICD-10-CM | POA: Diagnosis not present

## 2022-10-05 DIAGNOSIS — Z96642 Presence of left artificial hip joint: Secondary | ICD-10-CM | POA: Diagnosis not present

## 2022-10-05 DIAGNOSIS — H548 Legal blindness, as defined in USA: Secondary | ICD-10-CM | POA: Diagnosis not present

## 2022-10-09 DIAGNOSIS — E538 Deficiency of other specified B group vitamins: Secondary | ICD-10-CM | POA: Diagnosis not present

## 2022-10-09 DIAGNOSIS — H547 Unspecified visual loss: Secondary | ICD-10-CM | POA: Diagnosis not present

## 2022-10-09 DIAGNOSIS — R633 Feeding difficulties, unspecified: Secondary | ICD-10-CM | POA: Diagnosis not present

## 2022-10-09 DIAGNOSIS — E559 Vitamin D deficiency, unspecified: Secondary | ICD-10-CM | POA: Diagnosis not present

## 2022-10-11 DIAGNOSIS — J4520 Mild intermittent asthma, uncomplicated: Secondary | ICD-10-CM | POA: Diagnosis not present

## 2022-10-11 DIAGNOSIS — I10 Essential (primary) hypertension: Secondary | ICD-10-CM | POA: Diagnosis not present

## 2022-10-11 DIAGNOSIS — Z96642 Presence of left artificial hip joint: Secondary | ICD-10-CM | POA: Diagnosis not present

## 2022-10-11 DIAGNOSIS — E782 Mixed hyperlipidemia: Secondary | ICD-10-CM | POA: Diagnosis not present

## 2022-10-11 DIAGNOSIS — H548 Legal blindness, as defined in USA: Secondary | ICD-10-CM | POA: Diagnosis not present

## 2022-10-11 DIAGNOSIS — Z471 Aftercare following joint replacement surgery: Secondary | ICD-10-CM | POA: Diagnosis not present

## 2022-10-18 DIAGNOSIS — E782 Mixed hyperlipidemia: Secondary | ICD-10-CM | POA: Diagnosis not present

## 2022-10-18 DIAGNOSIS — J4520 Mild intermittent asthma, uncomplicated: Secondary | ICD-10-CM | POA: Diagnosis not present

## 2022-10-18 DIAGNOSIS — I10 Essential (primary) hypertension: Secondary | ICD-10-CM | POA: Diagnosis not present

## 2022-10-18 DIAGNOSIS — Z471 Aftercare following joint replacement surgery: Secondary | ICD-10-CM | POA: Diagnosis not present

## 2022-10-18 DIAGNOSIS — Z96642 Presence of left artificial hip joint: Secondary | ICD-10-CM | POA: Diagnosis not present

## 2022-10-18 DIAGNOSIS — J45909 Unspecified asthma, uncomplicated: Secondary | ICD-10-CM | POA: Diagnosis not present

## 2022-10-18 DIAGNOSIS — H548 Legal blindness, as defined in USA: Secondary | ICD-10-CM | POA: Diagnosis not present

## 2022-10-23 DIAGNOSIS — Z96642 Presence of left artificial hip joint: Secondary | ICD-10-CM | POA: Diagnosis not present

## 2022-10-23 DIAGNOSIS — J4520 Mild intermittent asthma, uncomplicated: Secondary | ICD-10-CM | POA: Diagnosis not present

## 2022-10-23 DIAGNOSIS — E782 Mixed hyperlipidemia: Secondary | ICD-10-CM | POA: Diagnosis not present

## 2022-10-23 DIAGNOSIS — H548 Legal blindness, as defined in USA: Secondary | ICD-10-CM | POA: Diagnosis not present

## 2022-10-23 DIAGNOSIS — Z471 Aftercare following joint replacement surgery: Secondary | ICD-10-CM | POA: Diagnosis not present

## 2022-10-23 DIAGNOSIS — I10 Essential (primary) hypertension: Secondary | ICD-10-CM | POA: Diagnosis not present

## 2022-10-25 DIAGNOSIS — Z96642 Presence of left artificial hip joint: Secondary | ICD-10-CM | POA: Diagnosis not present

## 2022-10-30 DIAGNOSIS — Z471 Aftercare following joint replacement surgery: Secondary | ICD-10-CM | POA: Diagnosis not present

## 2022-10-30 DIAGNOSIS — E782 Mixed hyperlipidemia: Secondary | ICD-10-CM | POA: Diagnosis not present

## 2022-10-30 DIAGNOSIS — Z96642 Presence of left artificial hip joint: Secondary | ICD-10-CM | POA: Diagnosis not present

## 2022-10-30 DIAGNOSIS — I10 Essential (primary) hypertension: Secondary | ICD-10-CM | POA: Diagnosis not present

## 2022-10-30 DIAGNOSIS — H548 Legal blindness, as defined in USA: Secondary | ICD-10-CM | POA: Diagnosis not present

## 2022-10-30 DIAGNOSIS — J4520 Mild intermittent asthma, uncomplicated: Secondary | ICD-10-CM | POA: Diagnosis not present

## 2022-11-01 DIAGNOSIS — M6281 Muscle weakness (generalized): Secondary | ICD-10-CM | POA: Diagnosis not present

## 2022-11-01 DIAGNOSIS — M25552 Pain in left hip: Secondary | ICD-10-CM | POA: Diagnosis not present

## 2022-11-05 ENCOUNTER — Other Ambulatory Visit: Payer: Self-pay | Admitting: Cardiology

## 2022-11-08 DIAGNOSIS — R633 Feeding difficulties, unspecified: Secondary | ICD-10-CM | POA: Diagnosis not present

## 2022-11-08 DIAGNOSIS — E538 Deficiency of other specified B group vitamins: Secondary | ICD-10-CM | POA: Diagnosis not present

## 2022-11-08 DIAGNOSIS — H547 Unspecified visual loss: Secondary | ICD-10-CM | POA: Diagnosis not present

## 2022-11-08 DIAGNOSIS — M6281 Muscle weakness (generalized): Secondary | ICD-10-CM | POA: Diagnosis not present

## 2022-11-08 DIAGNOSIS — M25552 Pain in left hip: Secondary | ICD-10-CM | POA: Diagnosis not present

## 2022-11-08 DIAGNOSIS — E559 Vitamin D deficiency, unspecified: Secondary | ICD-10-CM | POA: Diagnosis not present

## 2022-11-09 DIAGNOSIS — H547 Unspecified visual loss: Secondary | ICD-10-CM | POA: Diagnosis not present

## 2022-11-09 DIAGNOSIS — E538 Deficiency of other specified B group vitamins: Secondary | ICD-10-CM | POA: Diagnosis not present

## 2022-11-09 DIAGNOSIS — E559 Vitamin D deficiency, unspecified: Secondary | ICD-10-CM | POA: Diagnosis not present

## 2022-11-09 DIAGNOSIS — R633 Feeding difficulties, unspecified: Secondary | ICD-10-CM | POA: Diagnosis not present

## 2022-11-14 DIAGNOSIS — M6281 Muscle weakness (generalized): Secondary | ICD-10-CM | POA: Diagnosis not present

## 2022-11-14 DIAGNOSIS — M25552 Pain in left hip: Secondary | ICD-10-CM | POA: Diagnosis not present

## 2022-11-20 DIAGNOSIS — M6281 Muscle weakness (generalized): Secondary | ICD-10-CM | POA: Diagnosis not present

## 2022-11-20 DIAGNOSIS — M25552 Pain in left hip: Secondary | ICD-10-CM | POA: Diagnosis not present

## 2022-11-24 DIAGNOSIS — M6281 Muscle weakness (generalized): Secondary | ICD-10-CM | POA: Diagnosis not present

## 2022-11-24 DIAGNOSIS — M25552 Pain in left hip: Secondary | ICD-10-CM | POA: Diagnosis not present

## 2022-11-28 DIAGNOSIS — M25552 Pain in left hip: Secondary | ICD-10-CM | POA: Diagnosis not present

## 2022-11-28 DIAGNOSIS — M6281 Muscle weakness (generalized): Secondary | ICD-10-CM | POA: Diagnosis not present

## 2022-12-06 DIAGNOSIS — H20823 Vogt-Koyanagi syndrome, bilateral: Secondary | ICD-10-CM | POA: Diagnosis not present

## 2022-12-06 DIAGNOSIS — M6281 Muscle weakness (generalized): Secondary | ICD-10-CM | POA: Diagnosis not present

## 2022-12-06 DIAGNOSIS — M25552 Pain in left hip: Secondary | ICD-10-CM | POA: Diagnosis not present

## 2022-12-08 DIAGNOSIS — M6281 Muscle weakness (generalized): Secondary | ICD-10-CM | POA: Diagnosis not present

## 2022-12-08 DIAGNOSIS — R633 Feeding difficulties, unspecified: Secondary | ICD-10-CM | POA: Diagnosis not present

## 2022-12-08 DIAGNOSIS — E559 Vitamin D deficiency, unspecified: Secondary | ICD-10-CM | POA: Diagnosis not present

## 2022-12-08 DIAGNOSIS — E538 Deficiency of other specified B group vitamins: Secondary | ICD-10-CM | POA: Diagnosis not present

## 2022-12-08 DIAGNOSIS — H547 Unspecified visual loss: Secondary | ICD-10-CM | POA: Diagnosis not present

## 2022-12-08 DIAGNOSIS — M25552 Pain in left hip: Secondary | ICD-10-CM | POA: Diagnosis not present

## 2022-12-10 ENCOUNTER — Other Ambulatory Visit: Payer: Self-pay | Admitting: Cardiology

## 2022-12-11 DIAGNOSIS — R633 Feeding difficulties, unspecified: Secondary | ICD-10-CM | POA: Diagnosis not present

## 2022-12-11 DIAGNOSIS — H547 Unspecified visual loss: Secondary | ICD-10-CM | POA: Diagnosis not present

## 2022-12-11 DIAGNOSIS — E559 Vitamin D deficiency, unspecified: Secondary | ICD-10-CM | POA: Diagnosis not present

## 2022-12-11 DIAGNOSIS — E538 Deficiency of other specified B group vitamins: Secondary | ICD-10-CM | POA: Diagnosis not present

## 2022-12-12 DIAGNOSIS — Z79899 Other long term (current) drug therapy: Secondary | ICD-10-CM | POA: Diagnosis not present

## 2022-12-12 DIAGNOSIS — E559 Vitamin D deficiency, unspecified: Secondary | ICD-10-CM | POA: Diagnosis not present

## 2022-12-12 DIAGNOSIS — I10 Essential (primary) hypertension: Secondary | ICD-10-CM | POA: Diagnosis not present

## 2022-12-12 DIAGNOSIS — E785 Hyperlipidemia, unspecified: Secondary | ICD-10-CM | POA: Diagnosis not present

## 2022-12-12 DIAGNOSIS — E538 Deficiency of other specified B group vitamins: Secondary | ICD-10-CM | POA: Diagnosis not present

## 2022-12-13 DIAGNOSIS — M25552 Pain in left hip: Secondary | ICD-10-CM | POA: Diagnosis not present

## 2022-12-13 DIAGNOSIS — M6281 Muscle weakness (generalized): Secondary | ICD-10-CM | POA: Diagnosis not present

## 2023-01-10 DIAGNOSIS — E559 Vitamin D deficiency, unspecified: Secondary | ICD-10-CM | POA: Diagnosis not present

## 2023-01-10 DIAGNOSIS — H547 Unspecified visual loss: Secondary | ICD-10-CM | POA: Diagnosis not present

## 2023-01-10 DIAGNOSIS — R633 Feeding difficulties, unspecified: Secondary | ICD-10-CM | POA: Diagnosis not present

## 2023-01-10 DIAGNOSIS — E538 Deficiency of other specified B group vitamins: Secondary | ICD-10-CM | POA: Diagnosis not present

## 2023-02-09 DIAGNOSIS — E559 Vitamin D deficiency, unspecified: Secondary | ICD-10-CM | POA: Diagnosis not present

## 2023-02-09 DIAGNOSIS — H547 Unspecified visual loss: Secondary | ICD-10-CM | POA: Diagnosis not present

## 2023-02-09 DIAGNOSIS — E538 Deficiency of other specified B group vitamins: Secondary | ICD-10-CM | POA: Diagnosis not present

## 2023-02-09 DIAGNOSIS — R633 Feeding difficulties, unspecified: Secondary | ICD-10-CM | POA: Diagnosis not present

## 2023-03-12 DIAGNOSIS — E538 Deficiency of other specified B group vitamins: Secondary | ICD-10-CM | POA: Diagnosis not present

## 2023-03-12 DIAGNOSIS — E559 Vitamin D deficiency, unspecified: Secondary | ICD-10-CM | POA: Diagnosis not present

## 2023-03-12 DIAGNOSIS — R633 Feeding difficulties, unspecified: Secondary | ICD-10-CM | POA: Diagnosis not present

## 2023-03-12 DIAGNOSIS — H547 Unspecified visual loss: Secondary | ICD-10-CM | POA: Diagnosis not present

## 2023-03-22 ENCOUNTER — Emergency Department: Payer: 59

## 2023-03-22 ENCOUNTER — Emergency Department
Admission: EM | Admit: 2023-03-22 | Discharge: 2023-03-22 | Disposition: A | Discharge status: Home / Self Care | Payer: 59 | Attending: Emergency Medicine | Admitting: Emergency Medicine

## 2023-03-22 ENCOUNTER — Other Ambulatory Visit: Payer: Self-pay

## 2023-03-22 DIAGNOSIS — R404 Transient alteration of awareness: Secondary | ICD-10-CM | POA: Diagnosis not present

## 2023-03-22 DIAGNOSIS — R6889 Other general symptoms and signs: Secondary | ICD-10-CM | POA: Diagnosis not present

## 2023-03-22 DIAGNOSIS — R110 Nausea: Secondary | ICD-10-CM | POA: Diagnosis not present

## 2023-03-22 DIAGNOSIS — R42 Dizziness and giddiness: Secondary | ICD-10-CM | POA: Diagnosis not present

## 2023-03-22 DIAGNOSIS — Z743 Need for continuous supervision: Secondary | ICD-10-CM | POA: Diagnosis not present

## 2023-03-22 LAB — CBC WITH DIFFERENTIAL/PLATELET
Abs Immature Granulocytes: 0.02 10*3/uL (ref 0.00–0.07)
Basophils Absolute: 0 10*3/uL (ref 0.0–0.1)
Basophils Relative: 0 %
Eosinophils Absolute: 0.1 10*3/uL (ref 0.0–0.5)
Eosinophils Relative: 2 %
HCT: 35.8 % — ABNORMAL LOW (ref 36.0–46.0)
Hemoglobin: 10.9 g/dL — ABNORMAL LOW (ref 12.0–15.0)
Immature Granulocytes: 0 %
Lymphocytes Relative: 27 %
Lymphs Abs: 1.9 10*3/uL (ref 0.7–4.0)
MCH: 22 pg — ABNORMAL LOW (ref 26.0–34.0)
MCHC: 30.4 g/dL (ref 30.0–36.0)
MCV: 72.2 fL — ABNORMAL LOW (ref 80.0–100.0)
Monocytes Absolute: 0.6 10*3/uL (ref 0.1–1.0)
Monocytes Relative: 9 %
Neutro Abs: 4.6 10*3/uL (ref 1.7–7.7)
Neutrophils Relative %: 62 %
Platelets: 183 10*3/uL (ref 150–400)
RBC: 4.96 MIL/uL (ref 3.87–5.11)
RDW: 15.8 % — ABNORMAL HIGH (ref 11.5–15.5)
WBC: 7.3 10*3/uL (ref 4.0–10.5)
nRBC: 0 % (ref 0.0–0.2)

## 2023-03-22 LAB — COMPREHENSIVE METABOLIC PANEL
ALT: 15 U/L (ref 0–44)
AST: 31 U/L (ref 15–41)
Albumin: 3.3 g/dL — ABNORMAL LOW (ref 3.5–5.0)
Alkaline Phosphatase: 67 U/L (ref 38–126)
Anion gap: 7 (ref 5–15)
BUN: 11 mg/dL (ref 8–23)
CO2: 26 mmol/L (ref 22–32)
Calcium: 8.8 mg/dL — ABNORMAL LOW (ref 8.9–10.3)
Chloride: 104 mmol/L (ref 98–111)
Creatinine, Ser: 0.61 mg/dL (ref 0.44–1.00)
GFR, Estimated: >60 mL/min (ref >60)
Glucose, Bld: 97 mg/dL (ref 70–99)
Potassium: 4.3 mmol/L (ref 3.5–5.1)
Sodium: 137 mmol/L (ref 135–145)
Total Bilirubin: 1.2 mg/dL (ref 0.3–1.2)
Total Protein: 6.9 g/dL (ref 6.5–8.1)

## 2023-03-22 LAB — TROPONIN I (HIGH SENSITIVITY): Troponin I (High Sensitivity): <2 ng/L (ref <18)

## 2023-03-22 MED ORDER — MECLIZINE HCL 25 MG PO TABS: 25.0000 mg | ORAL_TABLET | Freq: Three times a day (TID) | ORAL | 0 refills | Status: AC | PRN

## 2023-03-22 MED ORDER — SODIUM CHLORIDE 0.9 % IV BOLUS
1000.0000 mL | Freq: Once | INTRAVENOUS | Status: AC
  Administered 2023-03-22: 1000 mL via INTRAVENOUS

## 2023-03-22 MED ORDER — MECLIZINE HCL 25 MG PO TABS
25.0000 mg | ORAL_TABLET | Freq: Once | ORAL | Status: AC
  Administered 2023-03-22: 25 mg via ORAL
  Filled 2023-03-22: qty 1

## 2023-03-22 MED ORDER — ONDANSETRON 8 MG PO TBDP: 8.0000 mg | ORAL_TABLET | Freq: Three times a day (TID) | ORAL | 0 refills | Status: AC | PRN

## 2023-03-22 MED ORDER — ONDANSETRON HCL 4 MG/2ML IJ SOLN
4.0000 mg | Freq: Once | INTRAMUSCULAR | Status: DC
  Filled 2023-03-22: qty 2

## 2023-03-22 NOTE — ED Provider Notes (Signed)
[facility] Provider Note   Event Date/Time   First MD Initiated Contact with Patient 03/22/23 [phone]     (approximate) History  Dizziness  HPI ASHTAN PAYNTER is a 72 y.o. female with a stated past medical history of blindness in both eyes and vertigo who presents for nausea and vertigo symptoms with a sensation of the room is spinning around her has been going on over the past few days.  The patient states that when she woke this morning her symptoms were significantly worsened and she called EMS to transport to the hospital.  Patient is not taking any of her normal Antivert medication but was concerned that her blood pressure was high so took her amlodipine prior to transport. ROS: Patient currently denies any vision changes, tinnitus, difficulty speaking, facial droop, sore throat, chest pain, shortness of breath, abdominal pain, nausea/vomiting/diarrhea, dysuria, or weakness/numbness/paresthesias in any extremity   Physical Exam  Triage Vital Signs: ED Triage Vitals  Enc Vitals Group     BP 03/22/23 0913 (!) 157/79     Pulse Rate 03/22/23 0913 72     Resp 03/22/23 0913 15     Temp 03/22/23 0913 97.8 F (36.6 C)     Temp src --      SpO2 03/22/23 0913 95 %     Weight 03/22/23 0911 260 lb (117.9 kg)     Height 03/22/23 0911 5' 5.75" (1.67 m)     Head Circumference --      Peak Flow --      Pain Score 03/22/23 0913 0     Pain Loc --      Pain Edu? --      Excl. in [place]? --    Most recent vital signs: Vitals:   03/22/23 0913  BP: (!) 157/79  Pulse: 72  Resp: 15  Temp: 97.8 F (36.6 C)  SpO2: 95%   General: Awake, oriented x4. CV:  Good peripheral perfusion.  Resp:  Normal effort.  Abd:  No distention.  Other:  Elderly obese African-American female laying in bed in no acute distress.  Horizontal nystagmus on exam as well as peripheral hints exam to the right ED Results / Procedures / Treatments  Labs (all labs ordered are listed, but only  abnormal results are displayed) Labs Reviewed  COMPREHENSIVE METABOLIC PANEL - Abnormal; Notable for the following components:      Result Value   Calcium 8.8 (*)    Albumin 3.3 (*)    All other components within normal limits  CBC WITH DIFFERENTIAL/PLATELET - Abnormal; Notable for the following components:   Hemoglobin 10.9 (*)    HCT 35.8 (*)    MCV 72.2 (*)    MCH 22.0 (*)    RDW 15.8 (*)    All other components within normal limits  TROPONIN I (HIGH SENSITIVITY)  TROPONIN I (HIGH SENSITIVITY)   EKG ED ECG REPORT I, Naaman Plummer, the attending physician, personally viewed and interpreted this ECG. Date: 03/22/2023 EKG Time: 0938 Rate: 67 Rhythm: normal sinus rhythm QRS Axis: normal Intervals: normal ST/T Wave abnormalities: normal Narrative Interpretation: no evidence of acute ischemia RADIOLOGY ED MD interpretation: CT of the head without contrast interpreted by me shows no evidence of acute abnormalities including no intracerebral hemorrhage, obvious masses, or significant edema -Agree with radiology assessment Official radiology report(s): CT Head Wo Contrast  Result Date: 03/22/2023 CLINICAL DATA:  Provided history: Neuro deficit, acute, stroke suspected. Dizziness. EXAM: CT HEAD WITHOUT  CONTRAST TECHNIQUE: Contiguous axial images were obtained from the base of the skull through the vertex without intravenous contrast. RADIATION DOSE REDUCTION: This exam was performed according to the departmental dose-optimization program which includes automated exposure control, adjustment of the mA and/or kV according to patient size and/or use of iterative reconstruction technique. COMPARISON:  Brain MRI 03/01/2004.  Head CT 05/13/2009. FINDINGS: Brain: No age advanced or lobar predominant parenchymal atrophy. There is no acute intracranial hemorrhage. No demarcated cortical infarct. No extra-axial fluid collection. No evidence of an intracranial mass. No midline shift. Vascular: No  hyperdense vessel. Atherosclerotic calcifications. Skull: No fracture or aggressive osseous lesion. Sinuses/Orbits: No mass or acute finding within the imaged orbits. Postprocedural changes to the right globe. Chronic medially displaced fracture deformity of the left lamina papyracea. No significant paranasal sinus disease. IMPRESSION: No evidence of an acute intracranial abnormality. Electronically Signed   By: Kellie Simmering D.O.   On: 03/22/2023 10:11   PROCEDURES: Critical Care performed: No Procedures MEDICATIONS ORDERED IN ED: Medications  ondansetron (ZOFRAN) injection 4 mg (4 mg Intravenous Patient Refused/Not Given 03/22/23 0948)  meclizine (ANTIVERT) tablet 25 mg (25 mg Oral Given 03/22/23 0942)  sodium chloride 0.9 % bolus 1,000 mL (1,000 mLs Intravenous New Bag/Given 03/22/23 0941)   IMPRESSION / MDM / ASSESSMENT AND PLAN / ED COURSE  I reviewed the triage vital signs and the nursing notes.                             The patient is on the cardiac monitor to evaluate for evidence of arrhythmia and/or significant heart rate changes. Patient's presentation is most consistent with acute presentation with potential threat to life or bodily function. Based on History, Exam, and Findings, presentation not consistent with syncope, seizure, stroke, meningitis, symptomatic anemia (gastrointestinal bleed), Increased ICP (cerebral tumor/mass), ICH. Additionally, I have a low suspicion for AOM, labyrinthitis, or other infectious process. Tx: meclizine Reassessment: Prior to discharge symptoms controlled, patient well appearing. Disposition:  Discharge. Strict return precautions discussed w/ full understanding. Advise follow up with primary care provider within 24-48 hours.   FINAL CLINICAL IMPRESSION(S) / ED DIAGNOSES   Final diagnoses:  Vertigo  Lightheadedness  Nausea   Rx / DC Orders   ED Discharge Orders          Ordered    meclizine (ANTIVERT) 25 MG tablet  3 times daily PRN         03/22/23 1104    ondansetron (ZOFRAN-ODT) 8 MG disintegrating tablet  Every 8 hours PRN        03/22/23 1104           Note:  This document was prepared using Dragon voice recognition software and may include unintentional dictation errors.   Naaman Plummer, MD 03/22/23 [phone]

## 2023-03-22 NOTE — ED Notes (Signed)
Pt unable to sign for DC due to visual impairment. Pt verbalized understanding of discharge instructions and medications that were prescribed. Pt caregiver is taking patient home.

## 2023-03-26 ENCOUNTER — Telehealth: Payer: Self-pay

## 2023-03-26 NOTE — Telephone Encounter (Signed)
     Patient  visit on 3/28  at [facility]    Have you been able to follow up with your primary care physician? No   The patient was or was not able to obtain any needed medicine or equipment. Yes   Are there diet recommendations that you are having difficulty following? Na   Patient expresses understanding of discharge instructions and education provided has no other needs at this time.  Yes      [facility] [phone] [address], [place], [place] [address] Phone: [phone] Email: [email or web address]

## 2023-04-02 ENCOUNTER — Other Ambulatory Visit: Payer: Self-pay | Admitting: Cardiology

## 2023-04-06 DIAGNOSIS — N62 Hypertrophy of breast: Secondary | ICD-10-CM | POA: Diagnosis not present

## 2023-04-06 DIAGNOSIS — Z139 Encounter for screening, unspecified: Secondary | ICD-10-CM | POA: Diagnosis not present

## 2023-04-06 DIAGNOSIS — R42 Dizziness and giddiness: Secondary | ICD-10-CM | POA: Diagnosis not present

## 2023-04-12 DIAGNOSIS — H547 Unspecified visual loss: Secondary | ICD-10-CM | POA: Diagnosis not present

## 2023-04-12 DIAGNOSIS — R633 Feeding difficulties, unspecified: Secondary | ICD-10-CM | POA: Diagnosis not present

## 2023-04-12 DIAGNOSIS — E559 Vitamin D deficiency, unspecified: Secondary | ICD-10-CM | POA: Diagnosis not present

## 2023-04-12 DIAGNOSIS — E538 Deficiency of other specified B group vitamins: Secondary | ICD-10-CM | POA: Diagnosis not present

## 2023-06-13 DIAGNOSIS — Z79899 Other long term (current) drug therapy: Secondary | ICD-10-CM | POA: Diagnosis not present

## 2023-06-13 DIAGNOSIS — M87051 Idiopathic aseptic necrosis of right femur: Secondary | ICD-10-CM | POA: Diagnosis not present

## 2023-06-13 DIAGNOSIS — D649 Anemia, unspecified: Secondary | ICD-10-CM | POA: Diagnosis not present

## 2023-06-13 DIAGNOSIS — Z9181 History of falling: Secondary | ICD-10-CM | POA: Diagnosis not present

## 2023-06-13 DIAGNOSIS — E538 Deficiency of other specified B group vitamins: Secondary | ICD-10-CM | POA: Diagnosis not present

## 2023-06-13 DIAGNOSIS — Z1231 Encounter for screening mammogram for malignant neoplasm of breast: Secondary | ICD-10-CM | POA: Diagnosis not present

## 2023-06-13 DIAGNOSIS — E785 Hyperlipidemia, unspecified: Secondary | ICD-10-CM | POA: Diagnosis not present

## 2023-06-13 DIAGNOSIS — E559 Vitamin D deficiency, unspecified: Secondary | ICD-10-CM | POA: Diagnosis not present

## 2023-06-13 DIAGNOSIS — I700 Atherosclerosis of aorta: Secondary | ICD-10-CM | POA: Diagnosis not present

## 2023-06-13 DIAGNOSIS — I10 Essential (primary) hypertension: Secondary | ICD-10-CM | POA: Diagnosis not present

## 2023-07-17 ENCOUNTER — Other Ambulatory Visit: Payer: Self-pay | Admitting: Cardiology

## 2023-07-23 ENCOUNTER — Other Ambulatory Visit: Payer: Self-pay | Admitting: Cardiology

## 2023-08-06 ENCOUNTER — Other Ambulatory Visit: Payer: Self-pay | Admitting: Cardiology

## 2023-08-08 DIAGNOSIS — Z1231 Encounter for screening mammogram for malignant neoplasm of breast: Secondary | ICD-10-CM | POA: Diagnosis not present

## 2023-09-07 DIAGNOSIS — M545 Low back pain, unspecified: Secondary | ICD-10-CM | POA: Diagnosis not present

## 2023-09-07 DIAGNOSIS — Z96642 Presence of left artificial hip joint: Secondary | ICD-10-CM | POA: Diagnosis not present

## 2023-12-05 DIAGNOSIS — Z139 Encounter for screening, unspecified: Secondary | ICD-10-CM | POA: Diagnosis not present

## 2023-12-05 DIAGNOSIS — Z9181 History of falling: Secondary | ICD-10-CM | POA: Diagnosis not present

## 2023-12-05 DIAGNOSIS — Z0000 Encounter for general adult medical examination without abnormal findings: Secondary | ICD-10-CM | POA: Diagnosis not present

## 2023-12-13 DIAGNOSIS — F17210 Nicotine dependence, cigarettes, uncomplicated: Secondary | ICD-10-CM | POA: Diagnosis not present

## 2023-12-13 DIAGNOSIS — Z87891 Personal history of nicotine dependence: Secondary | ICD-10-CM | POA: Diagnosis not present

## 2023-12-13 DIAGNOSIS — Z122 Encounter for screening for malignant neoplasm of respiratory organs: Secondary | ICD-10-CM | POA: Diagnosis not present

## 2023-12-14 DIAGNOSIS — E538 Deficiency of other specified B group vitamins: Secondary | ICD-10-CM | POA: Diagnosis not present

## 2023-12-14 DIAGNOSIS — D649 Anemia, unspecified: Secondary | ICD-10-CM | POA: Diagnosis not present

## 2023-12-14 DIAGNOSIS — J439 Emphysema, unspecified: Secondary | ICD-10-CM | POA: Diagnosis not present

## 2023-12-14 DIAGNOSIS — Z79899 Other long term (current) drug therapy: Secondary | ICD-10-CM | POA: Diagnosis not present

## 2023-12-14 DIAGNOSIS — I10 Essential (primary) hypertension: Secondary | ICD-10-CM | POA: Diagnosis not present

## 2023-12-14 DIAGNOSIS — I700 Atherosclerosis of aorta: Secondary | ICD-10-CM | POA: Diagnosis not present

## 2023-12-14 DIAGNOSIS — E785 Hyperlipidemia, unspecified: Secondary | ICD-10-CM | POA: Diagnosis not present

## 2023-12-14 DIAGNOSIS — E559 Vitamin D deficiency, unspecified: Secondary | ICD-10-CM | POA: Diagnosis not present

## 2023-12-14 DIAGNOSIS — R42 Dizziness and giddiness: Secondary | ICD-10-CM | POA: Diagnosis not present

## 2023-12-14 DIAGNOSIS — I2510 Atherosclerotic heart disease of native coronary artery without angina pectoris: Secondary | ICD-10-CM | POA: Diagnosis not present

## 2023-12-14 DIAGNOSIS — M87051 Idiopathic aseptic necrosis of right femur: Secondary | ICD-10-CM | POA: Diagnosis not present

## 2023-12-21 DIAGNOSIS — Z13820 Encounter for screening for osteoporosis: Secondary | ICD-10-CM | POA: Diagnosis not present

## 2024-02-28 DIAGNOSIS — R0600 Dyspnea, unspecified: Secondary | ICD-10-CM | POA: Diagnosis not present

## 2024-02-28 DIAGNOSIS — Z1211 Encounter for screening for malignant neoplasm of colon: Secondary | ICD-10-CM | POA: Diagnosis not present

## 2024-02-28 DIAGNOSIS — R110 Nausea: Secondary | ICD-10-CM | POA: Diagnosis not present

## 2024-03-12 DIAGNOSIS — E538 Deficiency of other specified B group vitamins: Secondary | ICD-10-CM | POA: Diagnosis not present

## 2024-03-12 DIAGNOSIS — H547 Unspecified visual loss: Secondary | ICD-10-CM | POA: Diagnosis not present

## 2024-03-12 DIAGNOSIS — E559 Vitamin D deficiency, unspecified: Secondary | ICD-10-CM | POA: Diagnosis not present

## 2024-03-12 DIAGNOSIS — R633 Feeding difficulties, unspecified: Secondary | ICD-10-CM | POA: Diagnosis not present

## 2024-04-15 ENCOUNTER — Telehealth: Payer: Self-pay

## 2024-04-15 DIAGNOSIS — I2510 Atherosclerotic heart disease of native coronary artery without angina pectoris: Secondary | ICD-10-CM | POA: Diagnosis not present

## 2024-04-15 DIAGNOSIS — J439 Emphysema, unspecified: Secondary | ICD-10-CM | POA: Diagnosis not present

## 2024-04-15 DIAGNOSIS — R1013 Epigastric pain: Secondary | ICD-10-CM | POA: Diagnosis not present

## 2024-04-15 NOTE — Telephone Encounter (Signed)
 Pt has ref placed on recall list

## 2024-04-16 ENCOUNTER — Other Ambulatory Visit: Payer: Self-pay | Admitting: Family Medicine

## 2024-04-16 DIAGNOSIS — R1013 Epigastric pain: Secondary | ICD-10-CM

## 2024-04-25 ENCOUNTER — Ambulatory Visit
Admission: RE | Admit: 2024-04-25 | Discharge: 2024-04-25 | Disposition: A | Discharge status: Home / Self Care | Source: Ambulatory Visit | Attending: Family Medicine | Admitting: Family Medicine

## 2024-04-25 DIAGNOSIS — R1013 Epigastric pain: Secondary | ICD-10-CM | POA: Diagnosis not present

## 2024-04-25 DIAGNOSIS — K449 Diaphragmatic hernia without obstruction or gangrene: Secondary | ICD-10-CM | POA: Diagnosis not present

## 2024-04-25 DIAGNOSIS — Z9049 Acquired absence of other specified parts of digestive tract: Secondary | ICD-10-CM | POA: Diagnosis not present

## 2024-04-25 MED ORDER — IOHEXOL 300 MG/ML  SOLN
100.0000 mL | Freq: Once | INTRAMUSCULAR | Status: AC | PRN
  Administered 2024-04-25: 100 mL via INTRAVENOUS

## 2024-04-30 DIAGNOSIS — E538 Deficiency of other specified B group vitamins: Secondary | ICD-10-CM | POA: Diagnosis not present

## 2024-04-30 DIAGNOSIS — R633 Feeding difficulties, unspecified: Secondary | ICD-10-CM | POA: Diagnosis not present

## 2024-04-30 DIAGNOSIS — H547 Unspecified visual loss: Secondary | ICD-10-CM | POA: Diagnosis not present

## 2024-04-30 DIAGNOSIS — E559 Vitamin D deficiency, unspecified: Secondary | ICD-10-CM | POA: Diagnosis not present

## 2024-05-06 DIAGNOSIS — R1013 Epigastric pain: Secondary | ICD-10-CM | POA: Diagnosis not present

## 2024-05-06 DIAGNOSIS — M65312 Trigger thumb, left thumb: Secondary | ICD-10-CM | POA: Diagnosis not present

## 2024-05-29 DIAGNOSIS — E538 Deficiency of other specified B group vitamins: Secondary | ICD-10-CM | POA: Diagnosis not present

## 2024-05-29 DIAGNOSIS — E559 Vitamin D deficiency, unspecified: Secondary | ICD-10-CM | POA: Diagnosis not present

## 2024-05-29 DIAGNOSIS — R633 Feeding difficulties, unspecified: Secondary | ICD-10-CM | POA: Diagnosis not present

## 2024-05-29 DIAGNOSIS — H547 Unspecified visual loss: Secondary | ICD-10-CM | POA: Diagnosis not present

## 2024-06-12 DIAGNOSIS — E559 Vitamin D deficiency, unspecified: Secondary | ICD-10-CM | POA: Diagnosis not present

## 2024-06-12 DIAGNOSIS — E538 Deficiency of other specified B group vitamins: Secondary | ICD-10-CM | POA: Diagnosis not present

## 2024-06-12 DIAGNOSIS — D649 Anemia, unspecified: Secondary | ICD-10-CM | POA: Diagnosis not present

## 2024-06-12 DIAGNOSIS — M87051 Idiopathic aseptic necrosis of right femur: Secondary | ICD-10-CM | POA: Diagnosis not present

## 2024-06-12 DIAGNOSIS — I10 Essential (primary) hypertension: Secondary | ICD-10-CM | POA: Diagnosis not present

## 2024-06-12 DIAGNOSIS — R739 Hyperglycemia, unspecified: Secondary | ICD-10-CM | POA: Diagnosis not present

## 2024-06-12 DIAGNOSIS — I2510 Atherosclerotic heart disease of native coronary artery without angina pectoris: Secondary | ICD-10-CM | POA: Diagnosis not present

## 2024-06-12 DIAGNOSIS — J439 Emphysema, unspecified: Secondary | ICD-10-CM | POA: Diagnosis not present

## 2024-06-12 DIAGNOSIS — E785 Hyperlipidemia, unspecified: Secondary | ICD-10-CM | POA: Diagnosis not present

## 2024-06-12 DIAGNOSIS — Z79899 Other long term (current) drug therapy: Secondary | ICD-10-CM | POA: Diagnosis not present

## 2024-06-19 DIAGNOSIS — H2703 Aphakia, bilateral: Secondary | ICD-10-CM | POA: Diagnosis not present

## 2024-06-19 DIAGNOSIS — H20823 Vogt-Koyanagi syndrome, bilateral: Secondary | ICD-10-CM | POA: Diagnosis not present

## 2024-06-19 DIAGNOSIS — H540X55 Blindness right eye category 5, blindness left eye category 5: Secondary | ICD-10-CM | POA: Diagnosis not present

## 2024-06-30 DIAGNOSIS — E559 Vitamin D deficiency, unspecified: Secondary | ICD-10-CM | POA: Diagnosis not present

## 2024-06-30 DIAGNOSIS — H547 Unspecified visual loss: Secondary | ICD-10-CM | POA: Diagnosis not present

## 2024-06-30 DIAGNOSIS — E538 Deficiency of other specified B group vitamins: Secondary | ICD-10-CM | POA: Diagnosis not present

## 2024-06-30 DIAGNOSIS — R633 Feeding difficulties, unspecified: Secondary | ICD-10-CM | POA: Diagnosis not present

## 2024-07-14 DIAGNOSIS — M6281 Muscle weakness (generalized): Secondary | ICD-10-CM | POA: Diagnosis not present

## 2024-07-14 DIAGNOSIS — M25551 Pain in right hip: Secondary | ICD-10-CM | POA: Diagnosis not present

## 2024-07-16 DIAGNOSIS — M25551 Pain in right hip: Secondary | ICD-10-CM | POA: Diagnosis not present

## 2024-07-16 DIAGNOSIS — M6281 Muscle weakness (generalized): Secondary | ICD-10-CM | POA: Diagnosis not present

## 2024-07-21 DIAGNOSIS — M6281 Muscle weakness (generalized): Secondary | ICD-10-CM | POA: Diagnosis not present

## 2024-07-21 DIAGNOSIS — M25551 Pain in right hip: Secondary | ICD-10-CM | POA: Diagnosis not present

## 2024-07-23 DIAGNOSIS — M6281 Muscle weakness (generalized): Secondary | ICD-10-CM | POA: Diagnosis not present

## 2024-07-23 DIAGNOSIS — M25551 Pain in right hip: Secondary | ICD-10-CM | POA: Diagnosis not present

## 2024-07-29 DIAGNOSIS — M6281 Muscle weakness (generalized): Secondary | ICD-10-CM | POA: Diagnosis not present

## 2024-07-29 DIAGNOSIS — M25551 Pain in right hip: Secondary | ICD-10-CM | POA: Diagnosis not present

## 2024-08-01 DIAGNOSIS — M6281 Muscle weakness (generalized): Secondary | ICD-10-CM | POA: Diagnosis not present

## 2024-08-01 DIAGNOSIS — M25551 Pain in right hip: Secondary | ICD-10-CM | POA: Diagnosis not present

## 2024-08-04 ENCOUNTER — Telehealth: Payer: Self-pay | Admitting: Cardiology

## 2024-08-04 DIAGNOSIS — E559 Vitamin D deficiency, unspecified: Secondary | ICD-10-CM | POA: Diagnosis not present

## 2024-08-04 DIAGNOSIS — H547 Unspecified visual loss: Secondary | ICD-10-CM | POA: Diagnosis not present

## 2024-08-04 DIAGNOSIS — R633 Feeding difficulties, unspecified: Secondary | ICD-10-CM | POA: Diagnosis not present

## 2024-08-04 DIAGNOSIS — E538 Deficiency of other specified B group vitamins: Secondary | ICD-10-CM | POA: Diagnosis not present

## 2024-08-04 NOTE — Telephone Encounter (Signed)
 Called the patient and she reported that she has been having a dull achy pain under her left breast that radiates around her ribs to her shoulder blade. It comes and goes and she has no nausea or SOB. The achy feeling last 2 - 3 minutes. She also states that she has been having a lot of gas and when she burps the dull achy feeling goes away. Patient has no recent blood pressure values to report. Patient has an appointment with Dr. Edwyna on 8/14. Please advise.

## 2024-08-04 NOTE — Telephone Encounter (Signed)
  Pt c/o of Chest Pain: STAT if active CP, including tightness, pressure, jaw pain, radiating pain to shoulder/upper arm/back, CP unrelieved by Nitro. Symptoms reported of SOB, nausea, vomiting, sweating.  1. Are you having CP right now?   No  2. Are you experiencing any other symptoms (ex. SOB, nausea, vomiting, sweating)?   No  3. Is your CP continuous or coming and going?   Coming and going  4. Have you taken Nitroglycerin ?  No  5. How long have you been experiencing CP?  Patient stated this episode started last Wednesday/Thursday.    6. If NO CP at time of call then end call with telling Pt to call back or call 911 if Chest pain returns prior to return call from triage team.   Patient stated she has been having a dull achy chest pain under her left breast and shoulder area.  Patient noted she has also been having a lot of gas.  Patient has appointment scheduled with Dr. Edwyna on 8/14.

## 2024-08-06 ENCOUNTER — Other Ambulatory Visit: Payer: Self-pay

## 2024-08-07 ENCOUNTER — Ambulatory Visit: Attending: Cardiology | Admitting: Cardiology

## 2024-08-07 ENCOUNTER — Encounter: Payer: Self-pay | Admitting: Cardiology

## 2024-08-07 VITALS — BP 154/88 | HR 78 | Ht 66.0 in | Wt 271.2 lb

## 2024-08-07 DIAGNOSIS — E782 Mixed hyperlipidemia: Secondary | ICD-10-CM | POA: Diagnosis not present

## 2024-08-07 DIAGNOSIS — I10 Essential (primary) hypertension: Secondary | ICD-10-CM | POA: Diagnosis not present

## 2024-08-07 DIAGNOSIS — I700 Atherosclerosis of aorta: Secondary | ICD-10-CM | POA: Diagnosis not present

## 2024-08-07 NOTE — Patient Instructions (Signed)

## 2024-08-07 NOTE — Progress Notes (Signed)
 Cardiology Office Note:    Date:  08/07/2024   ID:  Jillian Taylor, DOB [birth year], MRN [id number]  PCP:  Stephanie Charlene CROME, MD  Cardiologist:  Jennifer JONELLE Crape, MD   Referring MD: Stephanie Charlene CROME, MD    ASSESSMENT:    1. Mixed dyslipidemia   2. Abdominal aortic atherosclerosis (HCC)   3. Essential hypertension   4. Morbid obesity (HCC)    PLAN:    In order of problems listed above:  Abdominal aortic atherosclerosis: Secondary prevention stressed with the patient.  Importance of compliance with diet medication stressed and patient verbalized standing.  She was advised to walk on a regular basis to the best of her ability. Central hypertension: Blood pressure stable and diet was emphasized.  Salt intake issues discussed.  She has an element of whitecoat hypertension.  No intervention.. Mixed dyslipidemia: On lipid-lowering medications followed by primary care.  Goal LDL should be less than 60 in view of aortic atherosclerosis. Obesity: Morbid: Weight reduction stressed diet emphasized.  Obesity explained and she promises to do better. Patient will be seen in follow-up appointment in 6 months or earlier if the patient has any concerns.    Medication Adjustments/Labs and Tests Ordered: Current medicines are reviewed at length with the patient today.  Concerns regarding medicines are outlined above.  Orders Placed This Encounter  Procedures   EKG 12-Lead   No orders of the defined types were placed in this encounter.    No chief complaint on file.    History of Present Illness:    Jillian Taylor is a 73 y.o. female.  Patient has past medical history of essential hypertension mixed dyslipidemia and morbid obesity.  She is visually impaired.  Recent CT scan has revealed abdominal aortic atherosclerosis.  She tells me that now she is back on statin started by primary care.  She had held it for a few months.  Her blood pressure is elevated but she says her blood pressure at  home consistently is in the range of 120/70.  She is very alert about her numbers.  She is very intelligent about this.  Past Medical History:  Diagnosis Date   Allergic rhinoconjunctivitis 11/08/2015   Alopecia areata 07/02/2007   Qualifier: Diagnosis of  By: Elizabeth MD, Heron     ANXIETY DEPRESSION 07/02/2007   Qualifier: Diagnosis of  By: Elizabeth MD, Heron     Arthritis    Aseptic necrosis of head or neck of femur 12/08/2011   Asthma    BACK PAIN, LUMBAR, CHRONIC 07/02/2007   Annotation: 5 mm retrolisthesis L5 on L4 Qualifier: Diagnosis of  By: Elizabeth MD, Heron     Blind    BPPV (benign paroxysmal positional vertigo), right 04/15/2020   Chest discomfort 03/24/2019   CONSTIPATION, RECURRENT 07/02/2007   Qualifier: Diagnosis of  By: Elizabeth MD, Barbara     Eczema    Essential hypertension 03/24/2019   Hearing loss 04/15/2020   History of total hip replacement 09/12/2022   History of total hip replacement, left 09/12/2022   Hypertension    LPRD (laryngopharyngeal reflux disease) 07/02/2007   Qualifier: Diagnosis of  By: Elizabeth MD, Heron MECH SYNDROME 07/02/2007   Qualifier: Diagnosis of  By: Elizabeth MD, Heron     Mild intermittent asthma 07/02/2007   Annotation: Possible Reactive airway disease Qualifier: Diagnosis of  By: Elizabeth MD, Barbara     Mixed dyslipidemia 03/24/2019   Morbid obesity (HCC) 12/08/2011   Obesity (  BMI 35.0-39.9 without comorbidity) 10/28/2020   Overweight 03/24/2019   PULMONARY NODULE 07/02/2007   Qualifier: History of  By: Elizabeth MD, Heron PULLER USE, QUIT 07/02/2007   Qualifier: Diagnosis of  By: Elizabeth MD, Heron     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 Qualifier: Diagnosis of  By: Elizabeth MD, Heron      Past Surgical History:  Procedure Laterality Date   CATARACT EXTRACTION, BILATERAL Bilateral    CHOLECYSTECTOMY     COLONOSCOPY     EYE SURGERY     LUNG BIOPSY      RETINAL DETACHMENT SURGERY Right    TONSILLECTOMY     TOTAL HIP ARTHROPLASTY Left 09/12/2022   Procedure: TOTAL HIP ARTHROPLASTY ANTERIOR APPROACH;  Surgeon: Leora Lynwood SAUNDERS, MD;  Location: [facility] ORS;  Service: Orthopedics;  Laterality: Left;    Current Medications: Current Meds  Medication Sig   albuterol  (ACCUNEB ) 0.63 MG/3ML nebulizer solution Take 1 ampule by nebulization every 6 (six) hours as needed for wheezing.   albuterol  (VENTOLIN  HFA) 108 (90 Base) MCG/ACT inhaler Inhale 2 puffs into the lungs every 4 (four) hours as needed for wheezing or shortness of breath.   amLODipine  (NORVASC ) 2.5 MG tablet Take 1 tablet (2.5 mg total) by mouth daily. Patient needs an appointment for further refills. 2 nd attempt   bisacodyl  (DULCOLAX) 5 MG EC tablet Take 1 tablet (5 mg total) by mouth daily as needed for moderate constipation.   Cholecalciferol (EQL VITAMIN D3) 50 MCG (2000 UT) CAPS Take 1 capsule by mouth daily.   Coenzyme Q10 (CO Q 10) 100 MG CAPS Take 1 capsule by mouth daily.   cyanocobalamin  (VITAMIN B12) 1000 MCG tablet Take 1,000 mcg by mouth daily.   fluticasone  (FLONASE ) 50 MCG/ACT nasal spray Place 1 spray into both nostrils in the morning and at bedtime.   HYDROcodone -acetaminophen  (NORCO/VICODIN) 5-325 MG tablet Take 1 tablet by mouth every 4 (four) hours as needed for moderate pain (pain score 4-6).   lidocaine (LIDODERM) 5 % Place 1 patch onto the skin daily as needed. Remove & Discard patch within 12 hours or as directed by MD   meclizine  (ANTIVERT ) 25 MG tablet Take 1 tablet (25 mg total) by mouth 3 (three) times daily as needed for dizziness.   Multiple Vitamins-Minerals (CENTRUM SILVER 50+WOMEN PO) Take 1 tablet by mouth daily.   ondansetron  (ZOFRAN -ODT) 8 MG disintegrating tablet Take 1 tablet (8 mg total) by mouth every 8 (eight) hours as needed for nausea or vomiting.   PRED FORTE  1 % ophthalmic suspension Place 1 drop into both eyes 2 (two) times daily.  1 drop in the  right eye 3 times daily   rosuvastatin  (CRESTOR ) 10 MG tablet Take 10 mg by mouth daily.   UNABLE TO FIND Take 1 capsule by mouth daily. [place] (magnesium  225 mg with zinc 7.5 mg and vitamin D3 25 mcg)     Allergies:   Methotrexate, Methotrexate derivatives, Penicillins, and Ciprofloxacin   Social History   Socioeconomic History   Marital status: Single    Spouse name: Not on file   Number of children: 2   Years of education: Not on file   Highest education level: Not on file  Occupational History   Not on file  Tobacco Use   Smoking status: Former    Types: Cigarettes   Smokeless tobacco: Former    Types: Engineer, drilling   Vaping status: Never Used  Substance and Sexual  Activity   Alcohol use: Yes    Alcohol/week: 2.0 standard drinks of alcohol    Types: 1 Glasses of wine, 1 Cans of beer per week    Comment: occassional   Drug use: No   Sexual activity: Not on file  Other Topics Concern   Not on file  Social History Narrative   Lives alone, blind, has aide daily from [place]   Social Drivers of Health   Financial Resource Strain: Not on file  Food Insecurity: No Food Insecurity (09/12/2022)   Hunger Vital Sign    Worried About Running Out of Food in the Last Year: Never true    Ran Out of Food in the Last Year: Never true  Transportation Needs: No Transportation Needs (09/12/2022)   PRAPARE - Administrator, Civil Service (Medical): No    Lack of Transportation (Non-Medical): No  Physical Activity: Not on file  Stress: Not on file  Social Connections: Not on file     Family History: The patient's family history includes Allergic Disorder in her brother; Allergic rhinitis in her sister; Asthma in her brother, sister, and sister.  ROS:   Please see the history of present illness.    All other systems reviewed and are negative.  EKGs/Labs/Other Studies Reviewed:    The following studies were reviewed today: .SABRAEKG  Interpretation Date/Time:  Thursday August 07 2024 09:20:51 EDT Ventricular Rate:  78 PR Interval:  138 QRS Duration:  82 QT Interval:  356 QTC Calculation: 405 R Axis:   22  Text Interpretation: Normal sinus rhythm Normal ECG When compared with ECG of 22-Mar-2023 09:38, PREVIOUS ECG IS PRESENT Confirmed by Edwyna Backers [phone]) on 08/07/2024 9:32:35 AM     Recent Labs: No results found for requested labs within last 365 days.  Recent Lipid Panel    Component Value Date/Time   CHOL 197 06/09/2008 2014   TRIG 101 06/09/2008 2014   HDL 73 06/09/2008 2014   CHOLHDL 2.7 Ratio 06/09/2008 2014   VLDL 20 06/09/2008 2014   LDLCALC 104 (H) 06/09/2008 2014    Physical Exam:    VS:  BP (!) 154/88   Pulse 78   Ht 5' 6 (1.676 m)   Wt 271 lb 3.2 oz (123 kg)   SpO2 97%   BMI 43.77 kg/m     Wt Readings from Last 3 Encounters:  08/07/24 271 lb 3.2 oz (123 kg)  03/22/23 260 lb (117.9 kg)  09/12/22 244 lb (110.7 kg)     GEN: Patient is in no acute distress HEENT: Normal NECK: No JVD; No carotid bruits LYMPHATICS: No lymphadenopathy CARDIAC: Hear sounds regular, 2/6 systolic murmur at the apex. RESPIRATORY:  Clear to auscultation without rales, wheezing or rhonchi  ABDOMEN: Soft, non-tender, non-distended MUSCULOSKELETAL:  No edema; No deformity  SKIN: Warm and dry NEUROLOGIC:  Alert and oriented x 3 PSYCHIATRIC:  Normal affect   Signed, Backers JONELLE Edwyna, MD  08/07/2024 9:33 AM    [facility]

## 2024-08-08 NOTE — Telephone Encounter (Signed)
 Called the patient and informed her of Dr. Karry recommendation to follow up with her appointment with Dr. Revankar. Patient stated that Dr. Edwyna addressed her concern at her appointment on 8/14 and she is feeling fine now. Patient had no further questions at this time.

## 2024-08-18 DIAGNOSIS — M6281 Muscle weakness (generalized): Secondary | ICD-10-CM | POA: Diagnosis not present

## 2024-08-18 DIAGNOSIS — M25551 Pain in right hip: Secondary | ICD-10-CM | POA: Diagnosis not present

## 2024-08-20 DIAGNOSIS — M25551 Pain in right hip: Secondary | ICD-10-CM | POA: Diagnosis not present

## 2024-08-20 DIAGNOSIS — M6281 Muscle weakness (generalized): Secondary | ICD-10-CM | POA: Diagnosis not present

## 2024-08-26 DIAGNOSIS — M6281 Muscle weakness (generalized): Secondary | ICD-10-CM | POA: Diagnosis not present

## 2024-08-26 DIAGNOSIS — M25551 Pain in right hip: Secondary | ICD-10-CM | POA: Diagnosis not present

## 2024-09-01 DIAGNOSIS — M25551 Pain in right hip: Secondary | ICD-10-CM | POA: Diagnosis not present

## 2024-09-01 DIAGNOSIS — M6281 Muscle weakness (generalized): Secondary | ICD-10-CM | POA: Diagnosis not present

## 2024-09-03 DIAGNOSIS — E538 Deficiency of other specified B group vitamins: Secondary | ICD-10-CM | POA: Diagnosis not present

## 2024-09-03 DIAGNOSIS — E559 Vitamin D deficiency, unspecified: Secondary | ICD-10-CM | POA: Diagnosis not present

## 2024-09-03 DIAGNOSIS — H547 Unspecified visual loss: Secondary | ICD-10-CM | POA: Diagnosis not present

## 2024-09-15 DIAGNOSIS — M25551 Pain in right hip: Secondary | ICD-10-CM | POA: Diagnosis not present

## 2024-09-15 DIAGNOSIS — M6281 Muscle weakness (generalized): Secondary | ICD-10-CM | POA: Diagnosis not present

## 2024-09-17 DIAGNOSIS — M25551 Pain in right hip: Secondary | ICD-10-CM | POA: Diagnosis not present

## 2024-09-17 DIAGNOSIS — M6281 Muscle weakness (generalized): Secondary | ICD-10-CM | POA: Diagnosis not present

## 2024-09-22 DIAGNOSIS — M25551 Pain in right hip: Secondary | ICD-10-CM | POA: Diagnosis not present

## 2024-09-22 DIAGNOSIS — M6281 Muscle weakness (generalized): Secondary | ICD-10-CM | POA: Diagnosis not present

## 2024-09-26 DIAGNOSIS — M6281 Muscle weakness (generalized): Secondary | ICD-10-CM | POA: Diagnosis not present

## 2024-09-26 DIAGNOSIS — M25551 Pain in right hip: Secondary | ICD-10-CM | POA: Diagnosis not present

## 2024-10-01 DIAGNOSIS — M6281 Muscle weakness (generalized): Secondary | ICD-10-CM | POA: Diagnosis not present

## 2024-10-01 DIAGNOSIS — M25551 Pain in right hip: Secondary | ICD-10-CM | POA: Diagnosis not present

## 2024-10-02 DIAGNOSIS — M6281 Muscle weakness (generalized): Secondary | ICD-10-CM | POA: Diagnosis not present

## 2024-10-02 DIAGNOSIS — M25551 Pain in right hip: Secondary | ICD-10-CM | POA: Diagnosis not present
